# Patient Record
Sex: Male | Born: 1970
Health system: Southern US, Community
[De-identification: ages and names within clinical notes are randomized; demographics above are authoritative.]

## PROBLEM LIST (undated history)

## (undated) DIAGNOSIS — G8929 Other chronic pain: Secondary | ICD-10-CM

## (undated) DIAGNOSIS — F32A Depression, unspecified: Secondary | ICD-10-CM

## (undated) DIAGNOSIS — F329 Major depressive disorder, single episode, unspecified: Secondary | ICD-10-CM

## (undated) DIAGNOSIS — F419 Anxiety disorder, unspecified: Secondary | ICD-10-CM

## (undated) DIAGNOSIS — M549 Dorsalgia, unspecified: Secondary | ICD-10-CM

## (undated) HISTORY — PX: BACK SURGERY: SHX140

---

## 2003-11-01 ENCOUNTER — Inpatient Hospital Stay (HOSPITAL_COMMUNITY): Admission: AD | Admit: 2003-11-01 | Discharge: 2003-11-04 | Payer: Self-pay | Admitting: Neurosurgery

## 2003-11-01 ENCOUNTER — Encounter (INDEPENDENT_AMBULATORY_CARE_PROVIDER_SITE_OTHER): Payer: Self-pay | Admitting: Specialist

## 2003-12-10 ENCOUNTER — Ambulatory Visit (HOSPITAL_BASED_OUTPATIENT_CLINIC_OR_DEPARTMENT_OTHER): Admission: RE | Admit: 2003-12-10 | Discharge: 2003-12-10 | Payer: Self-pay | Admitting: Urology

## 2008-09-05 ENCOUNTER — Emergency Department (HOSPITAL_BASED_OUTPATIENT_CLINIC_OR_DEPARTMENT_OTHER): Admission: EM | Admit: 2008-09-05 | Discharge: 2008-09-05 | Payer: Self-pay | Admitting: Emergency Medicine

## 2008-09-06 ENCOUNTER — Emergency Department (HOSPITAL_BASED_OUTPATIENT_CLINIC_OR_DEPARTMENT_OTHER): Admission: EM | Admit: 2008-09-06 | Discharge: 2008-09-06 | Payer: Self-pay | Admitting: Emergency Medicine

## 2010-09-01 ENCOUNTER — Emergency Department (HOSPITAL_BASED_OUTPATIENT_CLINIC_OR_DEPARTMENT_OTHER)
Admission: EM | Admit: 2010-09-01 | Discharge: 2010-09-01 | Disposition: A | Discharge status: Home / Self Care | Payer: Medicaid Other | Attending: Emergency Medicine | Admitting: Emergency Medicine

## 2010-09-01 DIAGNOSIS — F411 Generalized anxiety disorder: Secondary | ICD-10-CM | POA: Insufficient documentation

## 2010-11-20 NOTE — Discharge Summary (Signed)
NAME:  Paul Baldwin, Paul Baldwin                     ACCOUNT NO.:  [id number]   MEDICAL RECORD NO.:  [id number]                   PATIENT TYPE:  INP   LOCATION:  3007                                 FACILITY:  [facility]   PHYSICIAN:  Reinaldo Meeker, M.D.              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   PRIMARY OPERATIVE PROCEDURE:  L4 to S1 laminectomy with excision of dermal  sinus tract and release of tethered spinal cord.   HISTORY:  Mr. Everard is a 40 year old gentleman who was evaluated in the  office for back and bilateral leg pain with __________evidence of voiding  difficulty.  Skin showed a dimple at a dermal sinus tract, and MRI scan  showed evidence of a tethered spinal cord.  He was admitted at this time for  decompressive laminectomy, excision of his dermal sinus tract, and release  of his tethered cord.   HOSPITAL COURSE:  On April 29, the patient was taken to the operating room  and underwent the above-mentioned procedure.  He tolerated the procedure  well.  Postoperatively, he was neurologically intact.  He was kept flat in  bed for approximately three days to allow the dural opening to heal well.  Subsequently, he was allowed to increase his activities.  His wound healed  without issue.  There is no evidence of cerebral spinal fluid leakage.   On May 2, he was up, doing well, and tolerating a regular diet.  He was felt  that he could be discharged home.   DISCHARGE MEDICATIONS:  Pain medications to be used on a p.r.n. basis.  His  condition was improved versus admission.                                                Reinaldo Meeker, M.D.    ROK/MEDQ  D:  12/26/2003  T:  12/27/2003  Job:  [id number]

## 2010-11-20 NOTE — Op Note (Signed)
NAME:  Paul Baldwin, Paul Baldwin                     ACCOUNT NO.:  [id number]   MEDICAL RECORD NO.:  [id number]                   PATIENT TYPE:  AMB   LOCATION:  NESC                                 FACILITY:  [facility]   PHYSICIAN:  Excell Seltzer. Annabell Howells, M.D.                 DATE OF BIRTH:  Jun 01, 1971   DATE OF PROCEDURE:  12/10/2003  DATE OF DISCHARGE:                                 OPERATIVE REPORT   __________.   OPERATION/PROCEDURE:  Cystoscopy and urethral dilation.   PREOPERATIVE DIAGNOSIS:  Meatal stenosis.   POSTOPERATIVE DIAGNOSIS:  Meatal stenosis.   SURGEON:  Excell Seltzer. Annabell Howells, M.D.   ANESTHESIA:  General.   COMPLICATIONS:  None.   INDICATIONS:  Paul Baldwin is a 40 year old white male who I originally saw  for preoperative urodynamics prior to spinal surgery for a tethered cord.  He returned last week with increased irritative voiding symptoms and a very  slow stream.  He states that it began after his catheter was removed  following surgery.  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  He was complaining of a firm, tender nodule at the distal  urethra.  An attempt in the office to dilate the meatal stenosis was  unsuccessful due to the very severe nature of the narrowing.  It was felt  that treatment in the operating room was indicated.   DESCRIPTION OF PROCEDURE:  The patient had been placed on Levaquin on  Monday, and since taking the antibiotic had begun to have some relief of his  symptoms.  He was taken to the operating room where general anesthesia was  induced.  He was placed in the lithotomy position.  His peritoneum and  genitalia were prepped with Betadine solution and draped in the usual  sterile fashion.  He was actually positioned prior to induction of  anesthesia because of his recent spinal surgery.   An attempt was then made to visualize the meatal stenosis with a 17-French  cystoscope and a 12-degree lens after an initial attempt at passing a 10-  [place] [employer] sound was unsuccessful.  Using the cystoscope, I could see  where the actual lumen was but it was quite punctate.  I then passed a 0.035-  inch guidewire through the stenotic urethra.  There was some difficulty  finding the proper lumen but eventually I was able to negotiate it in and  through to the bladder.  I then used [employer] followers over the wire to  dilate him to 14-French.  At this point a 6-French ureteroscope was used to  visualize the urethra.  No additional stenosis was noted.  His prostatic  urethra was short without obstruction.  I then used [employer] sounds to  dilate him to 24-French.  He was very tight at 24-French.  At this point the  17-French cystoscope with a 12-degree lens was passed.  Inspection of the  bladder revealed no significant abnormalities.  Ureteral orifices were  unremarkable.  At this point the bladder was drained.  The  cystoscope was removed.  The patient was taken down from the lithotomy  position.  His anesthetic was reversed.  He was moved to the recovery room  in stable condition.  There were no complications.   He will be kept on his Levaquin for a total of one week and will follow up  with me next week for reevaluation.                                               Excell Seltzer. Annabell Howells, M.D.    JJW/MEDQ  D:  12/10/2003  T:  12/10/2003  Job:  [id number]   cc:   Reinaldo Meeker, M.D.  [address]  [place]  [place] [address]  Fax: [phone]

## 2010-11-20 NOTE — Op Note (Signed)
NAME:  Paul Baldwin, Paul Baldwin                      ACCOUNT NO.:  [id number]   MEDICAL RECORD NO.:  [id number]                   PATIENT TYPE:  OIB   LOCATION:  2899                                 FACILITY:  [facility]   PHYSICIAN:  Reinaldo Meeker, M.D.              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   OPERATION PERFORMED:  Excision of dermal sinus tract with L4 to S1  laminectomy and release of tethered spinal cord.   SECONDARY PROCEDURE:  Microdissection cauda equina.   SURGEON:  Reinaldo Meeker, M.D.   ASSISTANT:  Kathaleen Maser. Pool, M.D.   ANESTHESIA:  General.   DESCRIPTION OF PROCEDURE:  After being placed in prone position, the  patient's back was prepped and draped in the usual sterile fashion.  Incision was made in the lower lumbar spine and then ellipse was made around  the dermal sinus tract external opening.  Incision was carried down in a  straight line inferior to this tract.  Incision was carried down to the  fascia with continued ellipsing around the dermal sinus tract being carried  out down to the facia.  Subperiosteal dissection was then carried out  bilaterally at L4, L5 and S1 along the spinous processes and lamina.  Self-  retaining retractor was placed for exposure.  The spinous process  of L5 and  inferior aspect of L4 were removed.  The laminectomy of L5 was removed in  order to expose normal-appearing dura.  Boy removal was then carried out  around where the dermal sinus tract went down towards the dura.  When the  dura was well exposed, the sinus tract with the skin attached was amputated  just above the level of the dura.  The dura was then opened in a linear  fashion, starting approximately L5-S1 level and heading  inferiorly.  A  thickened filum terminale with some adherent reactive tissue was easily  identified and found to be coming up towards the dural surface in the dorsal  region.  Just proximal to its attachment, the filum was incised and then  incised again inferior to that and a section of filum was removed.  This  effectively released the tethered spinal cord.  Inspection at this time  showed the nerve roots going ventral to this abnormal granulation type  tissue and it was felt there was nothing to be gained by trying to dissect  them free.  At this time large amounts of irrigation were carried out.  The  dura was closed with interrupted Nurolon stitches.  A piece of Duragen  followed by Bioglue was placed over the dural closure.  The wound was then  closed in multiple layers of Vicryl  in the muscle, fascia, subcutaneous and  subcuticular tissues and staples on the skin.  A sterile dressing was then  applied and the patient was extubated and taken to the recovery room in  stable condition.                                               Reinaldo Meeker, M.D.    ROK/MEDQ  D:  11/01/2003  T:  11/01/2003  Job:  [id number]

## 2010-12-15 ENCOUNTER — Emergency Department (HOSPITAL_COMMUNITY)
Admission: EM | Admit: 2010-12-15 | Discharge: 2010-12-16 | Disposition: A | Discharge status: Other Acute Inpatient Hospital | Payer: Medicaid Other | Source: Home / Self Care | Attending: Emergency Medicine | Admitting: Emergency Medicine

## 2010-12-15 ENCOUNTER — Emergency Department (HOSPITAL_COMMUNITY): Admission: EM | Admit: 2010-12-15 | Payer: Self-pay | Source: Home / Self Care

## 2010-12-15 LAB — DIFFERENTIAL
Eosinophils Relative: 2 % (ref 0–5)
Lymphocytes Relative: 31 % (ref 12–46)
Monocytes Absolute: 0.4 10*3/uL (ref 0.1–1.0)
Monocytes Relative: 4 % (ref 3–12)
Neutro Abs: 6.3 10*3/uL (ref 1.7–7.7)

## 2010-12-15 LAB — ETHANOL: Alcohol, Ethyl (B): <11 mg/dL — ABNORMAL HIGH (ref 0–10)

## 2010-12-15 LAB — CBC
HCT: 43.2 % (ref 39.0–52.0)
Hemoglobin: 14.8 g/dL (ref 13.0–17.0)
MCH: 29.1 pg (ref 26.0–34.0)
MCHC: 34.3 g/dL (ref 30.0–36.0)
RDW: 13 % (ref 11.5–15.5)

## 2010-12-15 LAB — COMPREHENSIVE METABOLIC PANEL
BUN: 15 mg/dL (ref 6–23)
Calcium: 9.4 mg/dL (ref 8.4–10.5)
Creatinine, Ser: 1.09 mg/dL (ref 0.4–1.5)
GFR calc Af Amer: >60 mL/min (ref >60)
Glucose, Bld: 92 mg/dL (ref 70–99)
Total Protein: 7.1 g/dL (ref 6.0–8.3)

## 2010-12-15 LAB — RAPID URINE DRUG SCREEN, HOSP PERFORMED
Benzodiazepines: POSITIVE — AB
Cocaine: NOT DETECTED
Opiates: NOT DETECTED

## 2010-12-16 ENCOUNTER — Inpatient Hospital Stay (HOSPITAL_COMMUNITY)
Admission: AD | Admit: 2010-12-16 | Discharge: 2010-12-18 | DRG: 897 | Disposition: A | Discharge status: Home / Self Care | Payer: Medicaid Other | Source: Ambulatory Visit | Attending: Psychiatry | Admitting: Psychiatry

## 2010-12-16 DIAGNOSIS — F1120 Opioid dependence, uncomplicated: Secondary | ICD-10-CM

## 2010-12-16 DIAGNOSIS — F1994 Other psychoactive substance use, unspecified with psychoactive substance-induced mood disorder: Secondary | ICD-10-CM

## 2010-12-16 DIAGNOSIS — F329 Major depressive disorder, single episode, unspecified: Secondary | ICD-10-CM

## 2010-12-16 DIAGNOSIS — F1320 Sedative, hypnotic or anxiolytic dependence, uncomplicated: Secondary | ICD-10-CM

## 2010-12-16 DIAGNOSIS — R45851 Suicidal ideations: Secondary | ICD-10-CM

## 2010-12-16 DIAGNOSIS — F3289 Other specified depressive episodes: Secondary | ICD-10-CM

## 2010-12-17 DIAGNOSIS — F1120 Opioid dependence, uncomplicated: Secondary | ICD-10-CM

## 2010-12-17 DIAGNOSIS — F1320 Sedative, hypnotic or anxiolytic dependence, uncomplicated: Secondary | ICD-10-CM

## 2010-12-17 LAB — COMPREHENSIVE METABOLIC PANEL
ALT: 18 U/L (ref 0–53)
AST: 13 U/L (ref 0–37)
Albumin: 3.4 g/dL — ABNORMAL LOW (ref 3.5–5.2)
Alkaline Phosphatase: 77 U/L (ref 39–117)
Chloride: 105 mEq/L (ref 96–112)
Potassium: 3.9 mEq/L (ref 3.5–5.1)
Sodium: 140 mEq/L (ref 135–145)
Total Bilirubin: 0.4 mg/dL (ref 0.3–1.2)
Total Protein: 6.4 g/dL (ref 6.0–8.3)

## 2010-12-17 LAB — CBC
HCT: 42.3 % (ref 39.0–52.0)
Platelets: 187 10*3/uL (ref 150–400)
RDW: 13 % (ref 11.5–15.5)
WBC: 6.9 10*3/uL (ref 4.0–10.5)

## 2010-12-17 NOTE — H&P (Signed)
NAME:  Paul Baldwin, Paul Baldwin NO.:  [id number]  MEDICAL RECORD NO.:  [id number]  LOCATION:  0305                          [facility]  PHYSICIAN:  Franchot Gallo, MD     DATE OF BIRTH:  05-07-1971  DATE OF ADMISSION:  12/16/2010 DATE OF DISCHARGE:                      PSYCHIATRIC ADMISSION ASSESSMENT   This is a 40 year old male voluntarily admitted on December 16, 2010.  HISTORY OF PRESENT ILLNESS:  The patient states he is here to get detoxed off of methadone and Xanax.  He states that he has been over using his Xanax and he did make some passive suicidal thoughts to his wife about not wanting to be here.  He has been feeling depressed, reporting problems with motivation, trouble eating and sleeping more, laying on the couch.  He states that he would never harm himself due to his children.  He states that his mother was very concerned about him and had suggested the patient get evaluated for history of depression.  PAST PSYCHIATRIC HISTORY:  First admission to [facility]. Has been going to the [facility] for his methadone. He has been at 80 mg and has been it for he states "too long" for history of opiate abuse and recently has been prescribed Paxil but has not filled the medication.  SOCIAL HISTORY:  The patient is divorced, has two children ages 22 and 68.  He lives with his two children.  He works part-time.  He has no legal problems.  FAMILY HISTORY:  None.  ALCOHOL/DRUG HISTORY:  Denies any alcohol use.  Denies any other substance use.  PRIMARY CARE PROVIDER:  None.  PAST MEDICAL HISTORY:  Medical problems:  None.  MEDICATIONS:  Report being on Xanax 1 mg t.i.d. and his methadone 80 mg daily.  DRUG ALLERGIES:  No known allergies.  PHYSICAL EXAMINATION:  GENERAL:  Physical exam was done in the emergency room.  The patient is a normally-developed male.  He seems somewhat pale and tired but he offers no physical complaints  at this time.  LABORATORY DATA:  His CBC was within normal limits.  CMP within normal limits.  Alcohol level less than 11.  Urine drug screen is positive for benzodiazepines.  MENTAL STATUS EXAM:  The patient is in bed.  He is dressed in hospital scrubs.  He sits up and participates in the interview, minimal eye contact.  His speech is soft-spoken, clear, normal pace and tone.  The patient's mood is depressed.  The patient again looks tired and depressed.  Thought processes are coherent, goal directed.  No evidence of any psychotic symptoms.  He denies any suicidal or homicidal thoughts.  Cognitive function intact.  Memory appears intact.  Judgment and insight are fair.  AXIS I:  Opiate dependence, benzo dependence.  Depressive disorder NOS, rule out substance-induced mood disorder. AXIS II:  Deferred. AXIS III:  No known medical conditions. AXIS IV:  Other psychosocial problems rated to chronic substance use, possible problems with occupation. AXIS V:  Current is 40.  PLAN:  Our plan is to place the patient on the clonidine protocol, monitor withdrawal symptoms.  The patient may benefit from and an antidepressant and assess his motivation for rehab.  Will also identify his support group.  His tentative length of stay at this time is 2-3 days.     Landry Corporal, N.P.   ______________________________ Franchot Gallo, MD    JO/MEDQ  D:  12/17/2010  T:  12/17/2010  Job:  [id number]  Electronically Signed by Limmie PatriciaP. on 12/17/2010 12:27:39 PM Electronically Signed by Franchot Gallo MD on 12/17/2010 04:56:25 PM

## 2010-12-22 NOTE — Discharge Summary (Signed)
  NAME:  Paul Baldwin, Paul Baldwin NO.:  [id number]  MEDICAL RECORD NO.:  [id number]  LOCATION:  0305                          [facility]  PHYSICIAN:  Franchot Gallo, MD     DATE OF BIRTH:  1970-07-22  DATE OF ADMISSION:  12/16/2010 DATE OF DISCHARGE:  12/18/2010                              DISCHARGE SUMMARY   REASON FOR ADMISSION:  This is a 40 year old male here to get detoxed off of methadone and Xanax.  States he was over using his Xanax, having some passive suicidal thoughts, and making a statement about having passive suicidal thoughts to his wife about not wanting to be here, feeling depressed.  Reports positive motivation, trouble eating and sleeping, lying on the couch.  His deterrent for harm to self were his children.  FINAL IMPRESSION:   AXIS I:  Opiate dependence, benzodiazepine dependence, depressive disorder not otherwise specified, rule out substance-induced mood disorder. AXIS II:  Deferred. AXIS III:  No known medical conditions. AXIS IV:  Other psychosocial problems related to chronic substance use, possible problems with occupation. AXIS V:  Current is 55.  PERTINENT LABS:  CBC was within normal limits.  Alcohol level is less than 11.  Urine drug screen is positive for benzodiazepines.  SIGNIFICANT FINDINGS:  The patient was admitted to the [facility] on the [facility] fully alert, cooperative, but looked tired and depressed.  He was put on the clonidine protocol where we were monitoring his withdrawal symptoms.  The patient was participating in groups with good participation, reporting depression all his life, wanting to get help get off his medications.  On the day of discharge the patient stated he decided against an inpatient detox of methadone and wanted to pursue outpatient detox at the [name].  His sleep was decreased.  His appetite was decreased.  He was having mild to moderate depressive symptoms rating  it as a 5/10.  Adamantly denied any suicidal or homicidal thoughts or auditory or visual hallucinations.  Having mild to moderate methadone withdrawal symptoms.  The patient was wanting to be discharged to outpatient followup.  DISCHARGE MEDICATIONS: 1. Librium 25 mg completing the Librium protocol and then stopping. 2. Bentyl q.4 h p.r.n. abdominal cramping. 3. The patient was to stop taking his Xanax.  DISCHARGE FOLLOW UP:  Was with [facility] on December 21, 2010 at [phone].     Landry Corporal, N.P.   ______________________________ Franchot Gallo, MD    JO/MEDQ  D:  12/21/2010  T:  12/21/2010  Job:  [id number]  Electronically Signed by Limmie Patricia.P. on 12/22/2010 10:03:51 AM Electronically Signed by Franchot Gallo MD on 12/22/2010 04:24:51 PM

## 2011-03-20 ENCOUNTER — Emergency Department (HOSPITAL_BASED_OUTPATIENT_CLINIC_OR_DEPARTMENT_OTHER)
Admission: EM | Admit: 2011-03-20 | Discharge: 2011-03-20 | Disposition: A | Discharge status: Home / Self Care | Payer: Medicaid Other | Attending: Emergency Medicine | Admitting: Emergency Medicine

## 2011-03-20 ENCOUNTER — Emergency Department (INDEPENDENT_AMBULATORY_CARE_PROVIDER_SITE_OTHER): Payer: Medicaid Other

## 2011-03-20 ENCOUNTER — Encounter: Payer: Self-pay | Admitting: *Deleted

## 2011-03-20 DIAGNOSIS — S0003XA Contusion of scalp, initial encounter: Secondary | ICD-10-CM | POA: Insufficient documentation

## 2011-03-20 DIAGNOSIS — W19XXXA Unspecified fall, initial encounter: Secondary | ICD-10-CM | POA: Insufficient documentation

## 2011-03-20 DIAGNOSIS — F17200 Nicotine dependence, unspecified, uncomplicated: Secondary | ICD-10-CM | POA: Insufficient documentation

## 2011-03-20 DIAGNOSIS — S0083XA Contusion of other part of head, initial encounter: Secondary | ICD-10-CM | POA: Insufficient documentation

## 2011-03-20 DIAGNOSIS — S0093XA Contusion of unspecified part of head, initial encounter: Secondary | ICD-10-CM

## 2011-03-20 DIAGNOSIS — Y92009 Unspecified place in unspecified non-institutional (private) residence as the place of occurrence of the external cause: Secondary | ICD-10-CM | POA: Insufficient documentation

## 2011-03-20 DIAGNOSIS — R404 Transient alteration of awareness: Secondary | ICD-10-CM

## 2011-03-20 DIAGNOSIS — R55 Syncope and collapse: Secondary | ICD-10-CM | POA: Insufficient documentation

## 2011-03-20 MED ORDER — HYDROXYZINE HCL 25 MG PO TABS: 25.0000 mg | ORAL_TABLET | Freq: Once | ORAL | Status: AC

## 2011-03-20 NOTE — ED Provider Notes (Addendum)
History     CSN: [id number] Arrival date & time: 03/20/2011  6:18 PM   Chief Complaint  Patient presents with  . Loss of Consciousness     (Include location/radiation/quality/duration/timing/severity/associated sxs/prior treatment) Patient is a 40 y.o. male presenting with syncope. The history is provided by the patient. No language interpreter was used.  Loss of Consciousness This is a new problem. The current episode started today. The problem occurs constantly. The problem has been gradually improving. Associated symptoms include weakness. The symptoms are aggravated by nothing. He has tried sleep for the symptoms. The treatment provided mild relief.  Pt reports he was working and very hot.  Pt reports he passed out and hit his head. Pt reports he had jerking per neighbor like he had a seizure.  Pt reports he refused EMS  But was asleep for about 5 hours.  Pt reports he has not been sleeping well. Pt reports he has been under a lot of stress.   Past Medical History  Diagnosis Date  . Spina bifida      Past Surgical History  Procedure Date  . Back surgery     History reviewed. No pertinent family history.  History  Substance Use Topics  . Smoking status: Current Everyday Smoker  . Smokeless tobacco: Not on file  . Alcohol Use: No      Review of Systems  Cardiovascular: Positive for syncope.  Neurological: Positive for weakness.  All other systems reviewed and are negative.    Allergies  Review of patient's allergies indicates no known allergies.  Home Medications   Current Outpatient Rx  Name Route Sig Dispense Refill  . IBUPROFEN 800 MG PO TABS Oral Take 1,600 mg by mouth every 8 (eight) hours as needed. pain       Physical Exam    BP 128/76  Pulse 80  Temp(Src) 98.4 F (36.9 C) (Oral)  Resp 20  Ht 6\' 3"  (1.905 m)  Wt 250 lb (113.399 kg)  BMI 31.25 kg/m2  SpO2 98%  Physical Exam  Vitals reviewed. Constitutional: He is oriented to person,  place, and time. He appears well-developed and well-nourished.  HENT:  Head: Normocephalic.  Eyes: Pupils are equal, round, and reactive to light.  Neck: Normal range of motion.  Cardiovascular: Normal rate.   Pulmonary/Chest: Effort normal.  Abdominal: Soft.  Musculoskeletal: Normal range of motion.  Neurological: He is alert and oriented to person, place, and time. He has normal reflexes.  Skin: Skin is warm.  Psychiatric: He has a normal mood and affect.    ED Course  Procedures  Results for orders placed during the hospital encounter of 12/16/10  CBC      Component Value Range   WBC 6.9  4.0 - 10.5 (K/uL)   RBC 4.97  4.22 - 5.81 (MIL/uL)   Hemoglobin 14.4  13.0 - 17.0 (g/dL)   HCT 40.9  81.1 - 91.4 (%)   MCV 85.1  78.0 - 100.0 (fL)   MCH 29.0  26.0 - 34.0 (pg)   MCHC 34.0  30.0 - 36.0 (g/dL)   RDW 78.2  95.6 - 21.3 (%)   Platelets 187  150 - 400 (K/uL)  COMPREHENSIVE METABOLIC PANEL      Component Value Range   Sodium 140  135 - 145 (mEq/L)   Potassium 3.9  3.5 - 5.1 (mEq/L)   Chloride 105  96 - 112 (mEq/L)   CO2 27  19 - 32 (mEq/L)   Glucose, Bld 96  70 - 99 (mg/dL)   BUN 13  6 - 23 (mg/dL)   Creatinine, Ser 9.14  0.4 - 1.5 (mg/dL)   Calcium 9.1  8.4 - 78.2 (mg/dL)   Total Protein 6.4  6.0 - 8.3 (g/dL)   Albumin 3.4 (*) 3.5 - 5.2 (g/dL)   AST 13  0 - 37 (U/L)   ALT 18  0 - 53 (U/L)   Alkaline Phosphatase 77  39 - 117 (U/L)   Total Bilirubin 0.4  0.3 - 1.2 (mg/dL)   GFR calc non Af Amer >60  >60 (mL/min)   GFR calc Af Amer >60  >60 (mL/min)  TSH      Component Value Range   TSH 0.792  0.350 - 4.500 (uIU/mL)   Ct Head Wo Contrast  03/20/2011  *RADIOLOGY REPORT*  Clinical Data: 40 year old male with loss of consciousness.  CT HEAD WITHOUT CONTRAST  Technique:  Contiguous axial images were obtained from the base of the skull through the vertex without contrast.  Comparison: None.  Findings: Mildly disconjugate gaze, otherwise negative orbit soft tissues.  Visualized scalp soft tissues are within normal limits. Visualized paranasal sinuses and mastoids are clear.  No acute osseous abnormality identified.  Cerebral volume is within normal limits for age.  No midline shift, ventriculomegaly, mass effect, evidence of mass lesion, intracranial hemorrhage or evidence of cortically based acute infarction.  Gray-white matter differentiation is within normal limits throughout the brain.  No suspicious intracranial vascular hyperdensity.  IMPRESSION: Normal noncontrast CT appearance of the brain.  Original Report Authenticated By: Harley Hallmark, M.D.     No diagnosis found.   MDM Pt has a history of narcotic and benzo abuse per old chart.  Pt requesting medication to sleep.  I will treat with visteril,   I doubt seizure.       Langston Masker, [place] 03/20/11 2058  Langston Masker, [place] 03/20/11 2105

## 2011-03-20 NOTE — ED Notes (Signed)
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 Got hot and was trying to cool off. Began shaking and the next thing he knew he woke up 3-4 hours later. EMS was called and came out. VS and FSBS was done and were"perfect". Under a great deal of stress.

## 2011-03-20 NOTE — ED Provider Notes (Signed)
Addendum reviewed and agree  Dayton Bailiff, MD 03/20/11 2238

## 2011-03-20 NOTE — ED Provider Notes (Signed)
Medical screening examination/treatment/procedure(s) were performed by non-physician practitioner and as supervising physician I was immediately available for consultation/collaboration.   Dayton Bailiff, MD 03/20/11 [phone]

## 2011-03-25 ENCOUNTER — Encounter: Payer: Self-pay | Admitting: *Deleted

## 2011-03-25 ENCOUNTER — Emergency Department (HOSPITAL_BASED_OUTPATIENT_CLINIC_OR_DEPARTMENT_OTHER)
Admission: EM | Admit: 2011-03-25 | Discharge: 2011-03-25 | Disposition: A | Discharge status: Home / Self Care | Payer: Self-pay | Attending: Emergency Medicine | Admitting: Emergency Medicine

## 2011-03-25 DIAGNOSIS — F3289 Other specified depressive episodes: Secondary | ICD-10-CM | POA: Insufficient documentation

## 2011-03-25 DIAGNOSIS — F411 Generalized anxiety disorder: Secondary | ICD-10-CM | POA: Insufficient documentation

## 2011-03-25 DIAGNOSIS — F329 Major depressive disorder, single episode, unspecified: Secondary | ICD-10-CM | POA: Insufficient documentation

## 2011-03-25 DIAGNOSIS — F410 Panic disorder [episodic paroxysmal anxiety] without agoraphobia: Secondary | ICD-10-CM | POA: Insufficient documentation

## 2011-03-25 DIAGNOSIS — F17200 Nicotine dependence, unspecified, uncomplicated: Secondary | ICD-10-CM | POA: Insufficient documentation

## 2011-03-25 HISTORY — DX: Anxiety disorder, unspecified: F41.9

## 2011-03-25 MED ORDER — LORAZEPAM 1 MG PO TABS: 1.0000 mg | ORAL_TABLET | Freq: Three times a day (TID) | ORAL | Status: AC | PRN

## 2011-03-25 NOTE — ED Notes (Signed)
Pt c/o increased anxiety and panic attacks x 3 days. Pt states increased stress

## 2011-03-25 NOTE — ED Provider Notes (Addendum)
History     CSN: [id number] Arrival date & time: 03/25/2011  2:42 PM  Chief Complaint  Patient presents with  . Panic Attack    HPI  (Consider location/radiation/quality/duration/timing/severity/associated sxs/prior treatment)  Patient is a 40 y.o. male presenting with anxiety. The history is provided by the patient. No language interpreter was used.  Anxiety This is a chronic problem. The current episode started more than 1 week ago. The problem occurs constantly. The problem has not changed since onset.Pertinent negatives include no chest pain, no abdominal pain and no shortness of breath. The symptoms are aggravated by stress. The symptoms are relieved by medications (Patient has used alprazolam in the past.). He has tried nothing for the symptoms. The treatment provided no relief.   Patient has a history of anxiety and depression has been hospitalized once previously for depression. He denies suicidal or homicidal ideation today. He is on her primary care provider and does not have any access to psychiatric care at this time.  He says that he's had worsening panic attacks recently. He denies any pain with these. He says he occasionally gets some nausea while he's in the middle of panic attack. Here he seemed dynamically stable and not actively having a panic attack. Past Medical History  Diagnosis Date  . Anxiety   . Spina bifida     Past Surgical History  Procedure Date  . Back surgery     History reviewed. No pertinent family history.  patient does mention there is a family history of alcoholism.  History  Substance Use Topics  . Smoking status: Current Everyday Smoker -- 1.0 packs/day  . Smokeless tobacco: Not on file  . Alcohol Use:       Review of Systems  Review of Systems  Constitutional: Negative.   HENT: Negative.   Eyes: Negative.   Respiratory: Negative.  Negative for shortness of breath.   Cardiovascular: Negative.  Negative for chest pain.    Gastrointestinal: Negative.  Negative for abdominal pain.  Genitourinary: Negative.   Musculoskeletal: Negative.   Neurological: Negative.   Hematological: Negative.   Psychiatric/Behavioral: Positive for sleep disturbance, dysphoric mood and decreased concentration. Negative for suicidal ideas, hallucinations and self-injury. The patient is nervous/anxious.        Panic attacks.  All other systems reviewed and are negative.    Allergies  Review of patient's allergies indicates no known allergies.  Home Medications   Current Outpatient Rx  Name Route Sig Dispense Refill  . LORAZEPAM 1 MG PO TABS Oral Take 1 tablet (1 mg total) by mouth 3 (three) times daily as needed for anxiety. 15 tablet 0    Physical Exam    BP 111/73  Pulse 92  Temp(Src) 98.3 F (36.8 C) (Oral)  Resp 20  SpO2 98%  Physical Exam  Nursing note and vitals reviewed. Constitutional: He is oriented to person, place, and time. He appears well-developed and well-nourished. No distress.  HENT:  Head: Normocephalic.  Eyes: Conjunctivae and EOM are normal. Pupils are equal, round, and reactive to light.  Neck: Normal range of motion. Neck supple.  Cardiovascular: Normal rate, regular rhythm and normal heart sounds.  Exam reveals no gallop and no friction rub.   No murmur heard. Pulmonary/Chest: Effort normal and breath sounds normal. No respiratory distress. He has no wheezes. He has no rales.  Abdominal: Soft. Bowel sounds are normal. He exhibits no distension. There is no tenderness.  Musculoskeletal: Normal range of motion.  Neurological: He is alert and  oriented to person, place, and time. No cranial nerve deficit. He exhibits normal muscle tone. Coordination normal.  Skin: Skin is warm and dry. No rash noted.  Psychiatric: His speech is normal and behavior is normal. Judgment normal. His mood appears anxious. Cognition and memory are normal. He exhibits a depressed mood. He expresses no homicidal and no  suicidal ideation. He expresses no suicidal plans and no homicidal plans.    ED Course  Procedures (including critical care time)  Labs Reviewed - No data to display No results found.   1. Depression   2. Anxiety state      MDM Patient was examined by myself and seemed dynamically stable. He was not actively having a panic attack but instead was here to find resources for his recent issues with depression and anxiety. Patient did alprazolam previously. This is prescribed by his primary care provider. He no longer has insurance for a job and therefore does not have a PCP he sees regularly. Patient was given a list of outpatient psychiatric care resources. He absolutely denied suicidal or homicidal ideation. Patient was safe for discharge. He was encouraged to return immediately if he develops suicidal homicidal ideation. Patient was transporting himself today and was not given any medications here in the ED due to this in addition to the fact that he was not actively having a panic attack. I was comfortable with prescribing the patient a short course of benzos should he require them. Patient was told that this cannot be.a regular basis that he would need to find a regular psychiatric care provider.  Assessment: 40 year old who presents today with complaint of increased panic attacks.  Plan: Discharge home in good condition with short course of benzodiazepines and resource referral sheet.        Cyndra Numbers, MD 03/25/11 1540  Cyndra Numbers, MD 04/15/11 [phone]

## 2011-04-28 ENCOUNTER — Encounter: Payer: Self-pay | Admitting: *Deleted

## 2011-06-26 ENCOUNTER — Emergency Department (HOSPITAL_BASED_OUTPATIENT_CLINIC_OR_DEPARTMENT_OTHER)
Admission: EM | Admit: 2011-06-26 | Discharge: 2011-06-26 | Disposition: A | Discharge status: Home / Self Care | Payer: Self-pay | Attending: Emergency Medicine | Admitting: Emergency Medicine

## 2011-06-26 ENCOUNTER — Encounter (HOSPITAL_BASED_OUTPATIENT_CLINIC_OR_DEPARTMENT_OTHER): Payer: Self-pay | Admitting: *Deleted

## 2011-06-26 DIAGNOSIS — Z760 Encounter for issue of repeat prescription: Secondary | ICD-10-CM | POA: Insufficient documentation

## 2011-06-26 DIAGNOSIS — F419 Anxiety disorder, unspecified: Secondary | ICD-10-CM

## 2011-06-26 DIAGNOSIS — F411 Generalized anxiety disorder: Secondary | ICD-10-CM | POA: Insufficient documentation

## 2011-06-26 DIAGNOSIS — F17200 Nicotine dependence, unspecified, uncomplicated: Secondary | ICD-10-CM | POA: Insufficient documentation

## 2011-06-26 MED ORDER — ALPRAZOLAM 1 MG PO TABS: 1.0000 mg | ORAL_TABLET | Freq: Every evening | ORAL | Status: AC | PRN

## 2011-06-26 MED ORDER — ALPRAZOLAM 0.5 MG PO TABS
1.0000 mg | ORAL_TABLET | Freq: Once | ORAL | Status: AC
  Administered 2011-06-26: 1 mg via ORAL
  Filled 2011-06-26: qty 2

## 2011-06-26 NOTE — ED Provider Notes (Signed)
History     CSN: [id number]  Arrival date & time 06/26/11  1323   First MD Initiated Contact with Patient 06/26/11 1506      Chief Complaint  Patient presents with  . Anxiety    (Consider location/radiation/quality/duration/timing/severity/associated sxs/prior treatment) HPI Comments: Pt has run out of xanax.  His MD prescribes it TID for anxiety.  He is under significant new stresses.  He can not have his children for christmas.  He denies suicidality or homicidality.  Patient is a 40 y.o. male presenting with anxiety. The history is provided by the patient. No language interpreter was used.  Anxiety This is a new problem. The problem occurs constantly. The problem has been unchanged. The symptoms are aggravated by nothing. He has tried nothing for the symptoms.    Past Medical History  Diagnosis Date  . Anxiety   . Spina bifida     Past Surgical History  Procedure Date  . Back surgery     History reviewed. No pertinent family history.  History  Substance Use Topics  . Smoking status: Current Everyday Smoker -- 1.0 packs/day  . Smokeless tobacco: Not on file  . Alcohol Use: No      Review of Systems  Neurological: Negative for speech difficulty.  Psychiatric/Behavioral: Positive for dysphoric mood and agitation. Negative for suicidal ideas, behavioral problems, confusion, self-injury and decreased concentration. The patient is nervous/anxious.   All other systems reviewed and are negative.    Allergies  Review of patient's allergies indicates no known allergies.  Home Medications   Current Outpatient Rx  Name Route Sig Dispense Refill  . ALPRAZOLAM 1 MG PO TABS Oral Take 1 mg by mouth 3 (three) times daily.      . MULTIVITAMINS PO CAPS Oral Take 1 capsule by mouth daily.      . IBUPROFEN 800 MG PO TABS Oral Take 1,600 mg by mouth every 8 (eight) hours as needed. pain       BP 115/68  Pulse 68  Temp(Src) 98 F (36.7 C) (Oral)  Resp 20  Ht 6\' 3"   (1.905 m)  Wt 250 lb (113.399 kg)  BMI 31.25 kg/m2  SpO2 98%  Physical Exam  Nursing note and vitals reviewed. Constitutional: He is oriented to person, place, and time. He appears well-developed and well-nourished.  HENT:  Head: Normocephalic and atraumatic.  Eyes: EOM are normal.  Neck: Normal range of motion.  Cardiovascular: Normal rate, regular rhythm, normal heart sounds and intact distal pulses.   Pulmonary/Chest: Effort normal and breath sounds normal. No respiratory distress.  Abdominal: Soft. He exhibits no distension. There is no tenderness.  Musculoskeletal: Normal range of motion.  Neurological: He is alert and oriented to person, place, and time.  Skin: Skin is warm and dry.  Psychiatric: His speech is normal. Judgment and thought content normal. His mood appears anxious. His affect is angry. He is agitated. He is not aggressive, is not hyperactive, not slowed, not withdrawn, not actively hallucinating and not combative. Thought content is not paranoid and not delusional. Cognition and memory are not impaired. He does not express impulsivity or inappropriate judgment. He expresses no homicidal and no suicidal ideation. He expresses no suicidal plans and no homicidal plans. He exhibits normal recent memory and normal remote memory.       Pt is frustrated. He is attentive.    ED Course  Procedures (including critical care time)  Labs Reviewed - No data to display No results found.  No diagnosis found.    MDM          Worthy Rancher, PA 06/26/11 [phone]

## 2011-06-26 NOTE — ED Notes (Signed)
Pt states he had a "conversation" with his ex-wife this a.m. Asked to see kids, etc. And she refused. He is now experiencing anxiety, "Feel a lot of anger, like I could rip somebody's head off, but I'm not a violent person." No eye contact at triage. Took last Xanax this a.m. No money.

## 2011-06-27 NOTE — ED Provider Notes (Signed)
Medical screening examination/treatment/procedure(s) were performed by non-physician practitioner and as supervising physician I was immediately available for consultation/collaboration.   Darshay Deupree, MD 06/27/11 0015 

## 2011-07-17 ENCOUNTER — Encounter (HOSPITAL_BASED_OUTPATIENT_CLINIC_OR_DEPARTMENT_OTHER): Payer: Self-pay

## 2011-07-17 ENCOUNTER — Emergency Department (HOSPITAL_BASED_OUTPATIENT_CLINIC_OR_DEPARTMENT_OTHER)
Admission: EM | Admit: 2011-07-17 | Discharge: 2011-07-17 | Disposition: A | Discharge status: Home / Self Care | Payer: Self-pay | Attending: Emergency Medicine | Admitting: Emergency Medicine

## 2011-07-17 DIAGNOSIS — F3289 Other specified depressive episodes: Secondary | ICD-10-CM | POA: Insufficient documentation

## 2011-07-17 DIAGNOSIS — F419 Anxiety disorder, unspecified: Secondary | ICD-10-CM

## 2011-07-17 DIAGNOSIS — F329 Major depressive disorder, single episode, unspecified: Secondary | ICD-10-CM | POA: Insufficient documentation

## 2011-07-17 DIAGNOSIS — F411 Generalized anxiety disorder: Secondary | ICD-10-CM | POA: Insufficient documentation

## 2011-07-17 DIAGNOSIS — Z760 Encounter for issue of repeat prescription: Secondary | ICD-10-CM | POA: Insufficient documentation

## 2011-07-17 DIAGNOSIS — F17200 Nicotine dependence, unspecified, uncomplicated: Secondary | ICD-10-CM | POA: Insufficient documentation

## 2011-07-17 HISTORY — DX: Depression, unspecified: F32.A

## 2011-07-17 HISTORY — DX: Major depressive disorder, single episode, unspecified: F32.9

## 2011-07-17 MED ORDER — ALPRAZOLAM 1 MG PO TABS: 1.0000 mg | ORAL_TABLET | Freq: Every evening | ORAL | Status: AC | PRN

## 2011-07-17 NOTE — ED Provider Notes (Signed)
History     CSN: [id number]  Arrival date & time 07/17/11  1012   First MD Initiated Contact with Patient 07/17/11 1050      Chief Complaint  Patient presents with  . Depression    (Consider location/radiation/quality/duration/timing/severity/associated sxs/prior treatment) HPI Patient presents with anxiety and insomnia. He states that this is a chronic condition and occurs constantly. He did have a physician who prescribed Xanax 3 times daily for the past several years. He states he has lost his job and insurance and is not able to see this physician for refills any longer. He is under new increased stress do to losing his job and other personal problems. He specifically denies any suicidal or homicidal ideations. He has no associated systemic symptoms no fevers chills vomiting chest pain. There are no other alleviating or modifying factors.  Past Medical History  Diagnosis Date  . Anxiety   . Spina bifida   . Depression     Past Surgical History  Procedure Date  . Back surgery     No family history on file.  History  Substance Use Topics  . Smoking status: Current Everyday Smoker -- 1.0 packs/day  . Smokeless tobacco: Not on file  . Alcohol Use: No      Review of Systems ROS reviewed and otherwise negative except for mentioned in HPI  Allergies  Review of patient's allergies indicates no known allergies.  Home Medications   Current Outpatient Rx  Name Route Sig Dispense Refill  . ALPRAZOLAM 1 MG PO TABS Oral Take 1 mg by mouth 3 (three) times daily.      Marland Kitchen ALPRAZOLAM 1 MG PO TABS Oral Take 1 tablet (1 mg total) by mouth at bedtime as needed for sleep. 30 tablet 0  . ALPRAZOLAM 1 MG PO TABS Oral Take 1 tablet (1 mg total) by mouth at bedtime as needed for sleep. 30 tablet 0  . IBUPROFEN 800 MG PO TABS Oral Take 1,600 mg by mouth every 8 (eight) hours as needed. pain     . MULTIVITAMINS PO CAPS Oral Take 1 capsule by mouth daily.        BP 135/84  Pulse 93   Temp(Src) 99.2 F (37.3 C) (Oral)  Resp 18  Ht 6\' 3"  (1.905 m)  Wt 245 lb (111.131 kg)  BMI 30.62 kg/m2  SpO2 100% Vitals reviewed Physical Exam Physical Examination: General appearance - alert, well appearing, and in no distress Mental status - alert, oriented to person, place, and time Eyes - pupils equal and reactive Chest - clear to auscultation, no wheezes, rales or rhonchi, symmetric air entry Heart - normal rate, regular rhythm, normal S1, S2, no murmurs, rubs, clicks or gallops Abdomen - soft, nontender, nondistended, no masses or organomegaly Neurological - alert, oriented, normal speech, no focal findings Musculoskeletal - no joint tenderness, deformity or swelling Extremities - peripheral pulses normal, no pedal edema, no clubbing or cyanosis Skin - normal coloration and turgor, no rashes Psych- normal affect, mood  ED Course  Procedures (including critical care time)  Labs Reviewed - No data to display No results found.   1. Anxiety   2. Medication refill       MDM  Patient with a history of anxiety and depression presenting to to running out of his Xanax prescription. In reviewing his chart he has been seen twice before in the ED for benzodiazepine prescriptions. I have had a long discussion with him about needing to find a primary provider  to provide his chronic medications. He verbalizes understanding of this. I will prescribe a short course of Xanax today but patient is to be working on finding a primary physician for further medications. He is not suicidal or homicidal today by his report.        Ethelda Chick, MD 07/17/11 [phone]

## 2011-07-17 NOTE — ED Notes (Signed)
Pt c/o depression and anxiety.  Pt states he ran out of his Xanax 4 days ago.  Pt states he is currently unemployed with no insurance and has no PCP.

## 2011-08-26 ENCOUNTER — Encounter (HOSPITAL_BASED_OUTPATIENT_CLINIC_OR_DEPARTMENT_OTHER): Payer: Self-pay

## 2011-08-26 ENCOUNTER — Emergency Department (HOSPITAL_BASED_OUTPATIENT_CLINIC_OR_DEPARTMENT_OTHER)
Admission: EM | Admit: 2011-08-26 | Discharge: 2011-08-26 | Disposition: A | Discharge status: Home / Self Care | Payer: Self-pay | Attending: Emergency Medicine | Admitting: Emergency Medicine

## 2011-08-26 DIAGNOSIS — G8929 Other chronic pain: Secondary | ICD-10-CM | POA: Insufficient documentation

## 2011-08-26 DIAGNOSIS — F341 Dysthymic disorder: Secondary | ICD-10-CM | POA: Insufficient documentation

## 2011-08-26 DIAGNOSIS — M549 Dorsalgia, unspecified: Secondary | ICD-10-CM | POA: Insufficient documentation

## 2011-08-26 NOTE — Discharge Instructions (Signed)
Pain Management, Chronic You have a painful condition that has required frequent use of narcotic-type pain medicine. We would like to see that you receive the best possible care for your problem. To achieve this, you must have a personal physician who can supervise a treatment plan for you. You may locate a personal physician on your own or contact one of the doctors whose name has been given to you. If your physician determines that you need to visit the Emergency Department for pain control, that doctor should provide you with a pain contract. This is a letter from your doctor which describes what pain medicine you may receive, how much and how often. You sign it agreeing to the terms of the treatment plan. Bring this with each time you come to this facility. It will help the Emergency Physician provide the proper treatment for you with minimal delay. Please Note: In the future you may not be able to receive narcotic pain medicine from this facility without a pain contract or telephone approval from your personal physician. Document Released: 02/12/2004 Document Revised: 03/03/2011 Document Reviewed: 06/17/2008 [employer] Patient Information 2012 [employer]. 

## 2011-08-26 NOTE — ED Notes (Signed)
Pt reqesting prescript for xanax and narc, sofia with pt and instructed pt we will not be writing for any narcs

## 2011-08-26 NOTE — ED Provider Notes (Signed)
Medical screening examination/treatment/procedure(s) were performed by non-physician practitioner and as supervising physician I was immediately available for consultation/collaboration.   Rolan Bucco, MD 08/26/11 [phone]

## 2011-08-26 NOTE — ED Provider Notes (Signed)
History     CSN: [id number]  Arrival date & time 08/26/11  1737   First MD Initiated Contact with Patient 08/26/11 1841      Chief Complaint  Patient presents with  . Back Pain    (Consider location/radiation/quality/duration/timing/severity/associated sxs/prior treatment) Patient is a 41 y.o. male presenting with back pain. The history is provided by the patient. No language interpreter was used.  Back Pain  This is a chronic problem. The problem occurs constantly. The problem has been gradually worsening. The pain is associated with no known injury. The pain is present in the lumbar spine. The quality of the pain is described as stabbing and shooting. The pain is at a severity of 7/10. The pain is severe. The symptoms are aggravated by bending and twisting. He has tried analgesics and NSAIDs for the symptoms. The treatment provided no relief.  Pt reports he has spinabifida.  Pt reports he has had multiple back surgerys.  Pt reports he also has chronic anxiety and panic attacks. 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  Pt is out of and does not have an MD to refill.  Pt has been here for the same but has not been able to establish with an MD due to lack of insurance and money.  Past Medical History  Diagnosis Date  . Anxiety   . Spina bifida   . Depression     Past Surgical History  Procedure Date  . Back surgery     No family history on file.  History  Substance Use Topics  . Smoking status: Current Everyday Smoker -- 1.0 packs/day  . Smokeless tobacco: Not on file  . Alcohol Use: No      Review of Systems  Musculoskeletal: Positive for back pain.  All other systems reviewed and are negative.    Allergies  Review of patient's allergies indicates no known allergies.  Home Medications   Current Outpatient Rx  Name Route Sig Dispense Refill  . ACETAMINOPHEN 500 MG PO TABS Oral Take 100-2,500 mg by mouth every 6 (six) hours as needed. For pain     . ALPRAZOLAM 1 MG PO TABS Oral Take 1 mg by mouth 3 (three) times daily.      . IBUPROFEN 800 MG PO TABS Oral Take 800 mg by mouth every 8 (eight) hours as needed. pain    . ADULT MULTIVITAMIN W/MINERALS CH Oral Take 1 tablet by mouth daily.      BP 112/79  Pulse 96  Temp(Src) 98.2 F (36.8 C) (Oral)  Resp 16  Ht 6\' 3"  (1.905 m)  Wt 250 lb (113.399 kg)  BMI 31.25 kg/m2  SpO2 96%  Physical Exam  Nursing note and vitals reviewed. Constitutional: He appears well-developed and well-nourished.  HENT:  Head: Normocephalic and atraumatic.  Right Ear: External ear normal.  Left Ear: External ear normal.  Nose: Nose normal.  Mouth/Throat: Oropharynx is clear and moist.  Eyes: Conjunctivae and EOM are normal. Pupils are equal, round, and reactive to light.  Neck: Normal range of motion. Neck supple.  Abdominal: Soft.    ED Course  Procedures (including critical care time)  Labs Reviewed - No data to display No results found.   No diagnosis found.    MDM  [place] data base reviewed.  Pt has was prescribed 90 xanax Jan 19th.  Pt advised he needs to see his MD.         Langston Masker, [place] 08/26/11 1914

## 2011-08-26 NOTE — ED Notes (Signed)
C/o lower back pain since this am-denies injury-hx of back surgery

## 2012-01-01 ENCOUNTER — Emergency Department (HOSPITAL_BASED_OUTPATIENT_CLINIC_OR_DEPARTMENT_OTHER)
Admission: EM | Admit: 2012-01-01 | Discharge: 2012-01-01 | Disposition: A | Discharge status: Home / Self Care | Payer: Self-pay | Attending: Emergency Medicine | Admitting: Emergency Medicine

## 2012-01-01 ENCOUNTER — Encounter (HOSPITAL_BASED_OUTPATIENT_CLINIC_OR_DEPARTMENT_OTHER): Payer: Self-pay | Admitting: Emergency Medicine

## 2012-01-01 DIAGNOSIS — F411 Generalized anxiety disorder: Secondary | ICD-10-CM | POA: Insufficient documentation

## 2012-01-01 DIAGNOSIS — F3289 Other specified depressive episodes: Secondary | ICD-10-CM | POA: Insufficient documentation

## 2012-01-01 DIAGNOSIS — Z79899 Other long term (current) drug therapy: Secondary | ICD-10-CM | POA: Insufficient documentation

## 2012-01-01 DIAGNOSIS — F419 Anxiety disorder, unspecified: Secondary | ICD-10-CM

## 2012-01-01 DIAGNOSIS — F329 Major depressive disorder, single episode, unspecified: Secondary | ICD-10-CM | POA: Insufficient documentation

## 2012-01-01 DIAGNOSIS — K029 Dental caries, unspecified: Secondary | ICD-10-CM | POA: Insufficient documentation

## 2012-01-01 DIAGNOSIS — F17200 Nicotine dependence, unspecified, uncomplicated: Secondary | ICD-10-CM | POA: Insufficient documentation

## 2012-01-01 DIAGNOSIS — Q059 Spina bifida, unspecified: Secondary | ICD-10-CM | POA: Insufficient documentation

## 2012-01-01 LAB — COMPREHENSIVE METABOLIC PANEL
CO2: 29 mEq/L (ref 19–32)
Calcium: 9.2 mg/dL (ref 8.4–10.5)
Chloride: 100 mEq/L (ref 96–112)
Creatinine, Ser: 1 mg/dL (ref 0.50–1.35)
GFR calc Af Amer: >90 mL/min (ref >90)
GFR calc non Af Amer: >90 mL/min (ref >90)
Glucose, Bld: 108 mg/dL — ABNORMAL HIGH (ref 70–99)
Total Bilirubin: 0.2 mg/dL — ABNORMAL LOW (ref 0.3–1.2)

## 2012-01-01 MED ORDER — PENICILLIN V POTASSIUM 500 MG PO TABS: 500.0000 mg | ORAL_TABLET | Freq: Four times a day (QID) | ORAL | Status: AC

## 2012-01-01 MED ORDER — HYDROCODONE-ACETAMINOPHEN 5-325 MG PO TABS: ORAL_TABLET | ORAL | Status: DC

## 2012-01-01 MED ORDER — LORAZEPAM 1 MG PO TABS
0.5000 mg | ORAL_TABLET | Freq: Three times a day (TID) | ORAL | Status: AC | PRN
Start: 2012-01-01 — End: 2012-01-11

## 2012-01-01 MED ORDER — PENICILLIN V POTASSIUM 250 MG PO TABS
500.0000 mg | ORAL_TABLET | Freq: Once | ORAL | Status: AC
  Administered 2012-01-01: 500 mg via ORAL
  Filled 2012-01-01: qty 2

## 2012-01-01 NOTE — ED Notes (Signed)
C/O toothache, upper molar, x 2-3 days. States tooth has been broken off for "a while". Unable to afford to go to dentist. Right face swollen.

## 2012-01-02 NOTE — ED Provider Notes (Signed)
History     CSN: [id number]  Arrival date & time 01/01/12  4098   First MD Initiated Contact with Patient 01/01/12 1044      Chief Complaint  Patient presents with  . Dental Pain    (Consider location/radiation/quality/duration/timing/severity/associated sxs/prior treatment) HPI Patient is a 41 year old male who presents today complaining of right upper quadrant mouth pain with associated dental caries or the past 2-3 days. He reports that the pain is 10 out of 10. He reports that he has had fractures of these teeth for several weeks. He has not seen a dentist as he reports he cannot afford one. Patient also complains of having ongoing issues of anxiety. He apparently was on Xanax 1 mg 3 times a day previously but has not been to see his primary care provider to have this filled. Patient reports that given his 10 out of 10 dental pain he has been taking up to 6 extra strength Tylenol at a time. Last dose of Tylenol was at 2 AM this morning. Patient endorses some mild nausea. He denies fevers, night sweats, vomiting, or abdominal pain. Patient has no known history of liver disease. He is having no difficulty swallowing or breathing. His pain is worse with eating and nothing has made this better. There are no other associated or modifying factors. Past Medical History  Diagnosis Date  . Anxiety   . Spina bifida   . Depression     Past Surgical History  Procedure Date  . Back surgery     History reviewed. No pertinent family history.  History  Substance Use Topics  . Smoking status: Current Everyday Smoker -- 1.0 packs/day  . Smokeless tobacco: Not on file  . Alcohol Use: No      Review of Systems  Constitutional: Negative.   HENT: Positive for facial swelling and dental problem.   Eyes: Negative.   Respiratory: Negative.   Cardiovascular: Negative.   Gastrointestinal: Positive for nausea.  Genitourinary: Negative.   Musculoskeletal: Negative.   Skin: Negative.     Neurological: Negative.   Hematological: Negative.   Psychiatric/Behavioral: The patient is nervous/anxious.   All other systems reviewed and are negative.    Allergies  Review of patient's allergies indicates no known allergies.  Home Medications   Current Outpatient Rx  Name Route Sig Dispense Refill  . ACETAMINOPHEN 500 MG PO TABS Oral Take 100-2,500 mg by mouth every 6 (six) hours as needed. For pain    . ALPRAZOLAM 1 MG PO TABS Oral Take 1 mg by mouth 3 (three) times daily.      Marland Kitchen HYDROCODONE-ACETAMINOPHEN 5-325 MG PO TABS  Take 1-2 tablets by mouth every 6 hours when necessary pain. 10 tablet 0  . IBUPROFEN 800 MG PO TABS Oral Take 800 mg by mouth every 8 (eight) hours as needed. pain    . LORAZEPAM 1 MG PO TABS Oral Take 0.5 tablets (0.5 mg total) by mouth every 8 (eight) hours as needed for anxiety. 10 tablet 0  . ADULT MULTIVITAMIN W/MINERALS CH Oral Take 1 tablet by mouth daily.    Marland Kitchen PENICILLIN V POTASSIUM 500 MG PO TABS Oral Take 1 tablet (500 mg total) by mouth 4 (four) times daily. 28 tablet 0    BP 117/67  Pulse 64  Temp 98 F (36.7 C) (Oral)  Resp 18  Ht 6\' 3"  (1.905 m)  Wt 250 lb (113.399 kg)  BMI 31.25 kg/m2  SpO2 96%  Physical Exam  Nursing note and vitals  reviewed. GEN: Well-developed, well-nourished male in no distress HEENT: Atraumatic, patient noted to have right upper quadrant facial swelling over the right maxilla. Patient has numerous dental caries in the right upper quadrant. Fractured teeth are noted as well. Patient reports this has been present for some time. There is no buccal space or sublingual space fluctuance to suggest dental abscess.  EYES: PERRLA BL, no scleral icterus. No pain with extraocular movements. Extraocular movements are intact. NECK: Trachea midline, no meningismus. No limitation of range of motion. CV: regular rate and rhythm. No murmurs, rubs, or gallops PULM: No respiratory distress.  No crackles, wheezes, or rales. GI:  soft, non-tender. No guarding, rebound, or tenderness. + bowel sounds  GU: deferred Neuro: cranial nerves grossly 2-12 intact, no abnormalities of strength or sensation, A and O x 3 MSK: Patient moves all 4 extremities symmetrically, no deformity, edema, or injury noted Skin: No rashes petechiae, purpura, or jaundice Psych: Denies suicidal homicidal ideation. Does report anxiety.  ED Course  Procedures (including critical care time)  Labs Reviewed  COMPREHENSIVE METABOLIC PANEL - Abnormal; Notable for the following:    Glucose, Bld 108 (*)     Total Bilirubin 0.2 (*)     All other components within normal limits  ACETAMINOPHEN LEVEL   No results found.   1. Dental caries   2. Anxiety       MDM  Patient was evaluated by myself. Based on evaluation he did appear to have dental caries. There were no drainable abscess is noted. Patient was given penicillin 500 mg for this was discharged with a prescription for this. Patient also reported taking significant amounts of Tylenol. He was 8 hours from last ingestion. Conference metabolic panel and Tylenol level were performed. Tylenol level was less than 15. Liver function was also completely within normal limits. Patient was not vomiting here. He was felt cleared from this standpoint and was advised extensively about not taking too many Tylenol. Patient also reported anxiety and difficulty with having access to his medications. I did fill comfortable prescribing him Ativan 0.5 mg to be taken every 8 hours as needed for anxiety.notified the patient that this was not to be taken on a daily basis. Patient was given a total of 10 tablets. Patient was told that he should followup with behavioral health for assistance with obtaining appropriate followup and feeling these are medications as we cannot fill these through the emergency department on a regular basis. Patient was also given 10 tablets of Vicodin for pain. He was referred to Dr. Lucky Cowboy who was on  call for dentistry. Patient was told that he must call within 48 hours it was strongly encouraged to call first thing on Monday morning for followup. He was told that no upper front cost to be assessed. Patient was discharged in good condition.        Cyndra Numbers, MD 01/02/12 [phone]

## 2012-09-22 ENCOUNTER — Encounter (HOSPITAL_BASED_OUTPATIENT_CLINIC_OR_DEPARTMENT_OTHER): Payer: Self-pay

## 2012-09-22 ENCOUNTER — Emergency Department (HOSPITAL_BASED_OUTPATIENT_CLINIC_OR_DEPARTMENT_OTHER)
Admission: EM | Admit: 2012-09-22 | Discharge: 2012-09-22 | Disposition: A | Discharge status: Home / Self Care | Payer: Self-pay | Attending: Emergency Medicine | Admitting: Emergency Medicine

## 2012-09-22 DIAGNOSIS — S8990XA Unspecified injury of unspecified lower leg, initial encounter: Secondary | ICD-10-CM | POA: Insufficient documentation

## 2012-09-22 DIAGNOSIS — Y9389 Activity, other specified: Secondary | ICD-10-CM | POA: Insufficient documentation

## 2012-09-22 DIAGNOSIS — Z79899 Other long term (current) drug therapy: Secondary | ICD-10-CM | POA: Insufficient documentation

## 2012-09-22 DIAGNOSIS — F419 Anxiety disorder, unspecified: Secondary | ICD-10-CM

## 2012-09-22 DIAGNOSIS — Y929 Unspecified place or not applicable: Secondary | ICD-10-CM | POA: Insufficient documentation

## 2012-09-22 DIAGNOSIS — F411 Generalized anxiety disorder: Secondary | ICD-10-CM | POA: Insufficient documentation

## 2012-09-22 DIAGNOSIS — Z87798 Personal history of other (corrected) congenital malformations: Secondary | ICD-10-CM | POA: Insufficient documentation

## 2012-09-22 DIAGNOSIS — Z8659 Personal history of other mental and behavioral disorders: Secondary | ICD-10-CM | POA: Insufficient documentation

## 2012-09-22 DIAGNOSIS — S99919A Unspecified injury of unspecified ankle, initial encounter: Secondary | ICD-10-CM | POA: Insufficient documentation

## 2012-09-22 DIAGNOSIS — M79675 Pain in left toe(s): Secondary | ICD-10-CM

## 2012-09-22 DIAGNOSIS — F17200 Nicotine dependence, unspecified, uncomplicated: Secondary | ICD-10-CM | POA: Insufficient documentation

## 2012-09-22 DIAGNOSIS — IMO0002 Reserved for concepts with insufficient information to code with codable children: Secondary | ICD-10-CM | POA: Insufficient documentation

## 2012-09-22 MED ORDER — LORAZEPAM 1 MG PO TABS: 1.0000 mg | ORAL_TABLET | Freq: Three times a day (TID) | ORAL | Status: DC | PRN

## 2012-09-22 NOTE — ED Notes (Signed)
Pt reports he has a history of anxiety, has been on Xanax but not currently taking.  He also reports an injury to left 5th digit on left toe after dropping a heavy object on it.

## 2012-09-22 NOTE — ED Provider Notes (Signed)
History     CSN: [id number]  Arrival date & time 09/22/12  1124   First MD Initiated Contact with Patient 09/22/12 1206      Chief Complaint  Patient presents with  . Anxiety  . Toe Injury    (Consider location/radiation/quality/duration/timing/severity/associated sxs/prior treatment) HPI Pt presents with c/o worsening anxiety- has run out of xanax and feels this was helping to control his symptoms.  States he does not have money to see his PMD.  Denies feeling suicidal.  States that he is afraid of losing his job due to his level of anxiety.  Also c/o pain in left 5th toe after hitting it with a sledgehammer approx 1 week ago.  States he has not tried anything for pain.  There are no other associated systemic symptoms, there are no other alleviating or modifying factors.   Past Medical History  Diagnosis Date  . Anxiety   . Spina bifida   . Depression     Past Surgical History  Procedure Laterality Date  . Back surgery      No family history on file.  History  Substance Use Topics  . Smoking status: Current Every Day Smoker -- 1.00 packs/day  . Smokeless tobacco: Not on file  . Alcohol Use: No      Review of Systems ROS reviewed and all otherwise negative except for mentioned in HPI  Allergies  Review of patient's allergies indicates no known allergies.  Home Medications   Current Outpatient Rx  Name  Route  Sig  Dispense  Refill  . methadone (METHADOSE) 40 MG disintegrating tablet   Oral   Take 40 mg by mouth daily.         Marland Kitchen LORazepam (ATIVAN) 1 MG tablet   Oral   Take 1 tablet (1 mg total) by mouth 3 (three) times daily as needed for anxiety.   12 tablet   0     BP 137/84  Pulse 89  Temp(Src) 98.1 F (36.7 C) (Oral)  Resp 20  Ht 6\' 4"  (1.93 m)  Wt 250 lb (113.399 kg)  BMI 30.44 kg/m2  SpO2 97% Vitals reviewed Physical Exam Physical Examination: General appearance - alert, well appearing, and in no distress Mental status - alert,  oriented to person, place, and time Eyes - no conjunctival injection, no scleral icterus Mouth - mucous membranes moist, pharynx normal without lesions Chest - clear to auscultation, no wheezes, rales or rhonchi, symmetric air entry Heart - normal rate, regular rhythm, normal S1, S2, no murmurs, rubs, clicks or gallops Neurological - alert, oriented, normal speech, strength 5/5 in extremities x 4, sensation intact Extremities - peripheral pulses normal, no pedal edema, no clubbing or cyanosis Skin - normal coloration and turgor, no rashes, no suspicious skin lesions noted Psych- calm and cooperative, tearful when talking about his anxiety symptoms  ED Course  Procedures (including critical care time)  Labs Reviewed - No data to display No results found.   1. Anxiety   2. Toe pain, left       MDM  Pt presenting with anxiety off xanax.  Also c/o pain in toe after hitting it by accident with a sledgehammer.  Injury occurred one week ago.  Elected not to image toe as it was isolated injury and pt states he thinks that ibuprofen would help it, no signs of complications.  Long d/w patient about rx from ED for chronic anxiety.  Will give him short course of medication, but encouraged f/u with  PMD for further prescriptions.  Discharged with strict return precautions.  Pt agreeable with plan.        Ethelda Chick, MD 09/22/12 1310

## 2012-10-01 ENCOUNTER — Encounter (HOSPITAL_BASED_OUTPATIENT_CLINIC_OR_DEPARTMENT_OTHER): Payer: Self-pay

## 2012-10-01 ENCOUNTER — Emergency Department (HOSPITAL_BASED_OUTPATIENT_CLINIC_OR_DEPARTMENT_OTHER)
Admission: EM | Admit: 2012-10-01 | Discharge: 2012-10-01 | Disposition: A | Discharge status: Home / Self Care | Payer: Self-pay | Attending: Emergency Medicine | Admitting: Emergency Medicine

## 2012-10-01 DIAGNOSIS — Z79899 Other long term (current) drug therapy: Secondary | ICD-10-CM | POA: Insufficient documentation

## 2012-10-01 DIAGNOSIS — R630 Anorexia: Secondary | ICD-10-CM | POA: Insufficient documentation

## 2012-10-01 DIAGNOSIS — Z8659 Personal history of other mental and behavioral disorders: Secondary | ICD-10-CM

## 2012-10-01 DIAGNOSIS — G479 Sleep disorder, unspecified: Secondary | ICD-10-CM | POA: Insufficient documentation

## 2012-10-01 DIAGNOSIS — F3289 Other specified depressive episodes: Secondary | ICD-10-CM | POA: Insufficient documentation

## 2012-10-01 DIAGNOSIS — F410 Panic disorder [episodic paroxysmal anxiety] without agoraphobia: Secondary | ICD-10-CM | POA: Insufficient documentation

## 2012-10-01 DIAGNOSIS — F17200 Nicotine dependence, unspecified, uncomplicated: Secondary | ICD-10-CM | POA: Insufficient documentation

## 2012-10-01 DIAGNOSIS — Z8739 Personal history of other diseases of the musculoskeletal system and connective tissue: Secondary | ICD-10-CM | POA: Insufficient documentation

## 2012-10-01 DIAGNOSIS — F329 Major depressive disorder, single episode, unspecified: Secondary | ICD-10-CM | POA: Insufficient documentation

## 2012-10-01 MED ORDER — LORAZEPAM 1 MG PO TABS: 1.0000 mg | ORAL_TABLET | Freq: Four times a day (QID) | ORAL | Status: DC | PRN

## 2012-10-01 NOTE — ED Provider Notes (Signed)
History     CSN: [id number]  Arrival date & time 10/01/12  1057   First MD Initiated Contact with Patient 10/01/12 1208      Chief Complaint  Patient presents with  . Panic Attack    (Consider location/radiation/quality/duration/timing/severity/associated sxs/prior treatment) HPI Comments: Patient is a 42 y/o M with PMHx of anxiety and depression that has gotten progressively worse over the past 2-3 days. Patient was seen in ED on 09/22/2012 and was prescribed Lorazepam - patient ran out of medication. Patient reported episodes of anxiety can last days, feels like people are watching him, he is unable to sleep, has decreased appetite, has difficulty concentrating at work. Patient has been off of Xanax and currently taking Ativan 1 mg tablets four times a day for relief - but, patient reported he ran out of Ativan and panic attacks returned and increased. Patient reported trying to take deep breathes and reading the Bible with no help in calming down. Patient reported that if the panic attacks last a long time he has suicidal thoughts - patient denied suicidal ideation, thoughts of hurting others, and a plan at this moment. Patient denied chest pain, shortness of breathe, difficulty breathing, abdominal pain, dysphagia, odynphagia, urinary symptoms, leg pain, leg swelling, visual distortions, hallucinations.   Patient is a 42 y.o. male presenting with anxiety. The history is provided by the patient. No language interpreter was used.  Anxiety This is a recurrent problem. The current episode started in the past 7 days. The problem occurs constantly. The problem has been unchanged. Associated symptoms include anorexia. Pertinent negatives include no abdominal pain, arthralgias, change in bowel habit, chest pain, chills, fatigue, fever, headaches, nausea, neck pain, sore throat, urinary symptoms, visual change, vomiting or weakness. He has tried nothing for the symptoms.    Past Medical History   Diagnosis Date  . Anxiety   . Spina bifida   . Depression     Past Surgical History  Procedure Laterality Date  . Back surgery      History reviewed. No pertinent family history.  History  Substance Use Topics  . Smoking status: Current Every Day Smoker -- 2.00 packs/day for 25 years    Types: Cigarettes  . Smokeless tobacco: Never Used  . Alcohol Use: No      Review of Systems  Constitutional: Negative for fever, chills and fatigue.  HENT: Negative for sore throat and neck pain.   Cardiovascular: Negative for chest pain.  Gastrointestinal: Positive for anorexia. Negative for nausea, vomiting, abdominal pain, diarrhea, constipation and change in bowel habit.  Musculoskeletal: Negative for arthralgias.  Neurological: Negative for weakness and headaches.  Psychiatric/Behavioral: Positive for sleep disturbance and decreased concentration. Negative for hallucinations. The patient is nervous/anxious.   All other systems reviewed and are negative.    Allergies  Review of patient's allergies indicates no known allergies.  Home Medications   Current Outpatient Rx  Name  Route  Sig  Dispense  Refill  . ALPRAZolam (XANAX PO)   Oral   Take by mouth 4 (four) times daily as needed.         Marland Kitchen LORazepam (ATIVAN) 1 MG tablet   Oral   Take 1 tablet (1 mg total) by mouth 3 (three) times daily as needed for anxiety.   12 tablet   0   . methadone (METHADOSE) 40 MG disintegrating tablet   Oral   Take 40 mg by mouth daily.         Marland Kitchen LORazepam (ATIVAN)  1 MG tablet   Oral   Take 1 tablet (1 mg total) by mouth every 6 (six) hours as needed for anxiety.   10 tablet   0     BP 129/71  Pulse 77  Temp(Src) 98.9 F (37.2 C) (Oral)  Resp 18  Ht 6\' 4"  (1.93 m)  Wt 260 lb (117.935 kg)  BMI 31.66 kg/m2  SpO2 96%  Physical Exam  Nursing note and vitals reviewed. Constitutional: He is oriented to person, place, and time. He appears well-developed and well-nourished. No  distress.  HENT:  Head: Normocephalic and atraumatic.  Eyes: Conjunctivae and EOM are normal. Pupils are equal, round, and reactive to light. Right eye exhibits no discharge. Left eye exhibits no discharge.  Neck: Normal range of motion. Neck supple. No tracheal deviation present.  Cardiovascular: Normal rate, regular rhythm and intact distal pulses.  Exam reveals no friction rub.   No murmur heard. Pulmonary/Chest: Effort normal and breath sounds normal. No respiratory distress. He has no wheezes. He has no rales.  Abdominal: Soft. He exhibits no distension. There is no tenderness. There is no rebound.  Lymphadenopathy:    He has no cervical adenopathy.  Neurological: He is alert and oriented to person, place, and time. No cranial nerve deficit. He exhibits normal muscle tone. Coordination normal.  Skin: Skin is warm and dry. No rash noted. He is not diaphoretic. No erythema.  Psychiatric: His behavior is normal. Judgment and thought content normal.  Flat affect.     ED Course  Procedures (including critical care time)  Labs Reviewed - No data to display No results found.  Filed Vitals:   10/01/12 1110  BP: 129/71  Pulse: 77  Temp: 98.9 F (37.2 C)  Resp: 18     1. Panic attack   2. History of anxiety   3. History of depression       MDM  I personally evaluated and examined patient - physical exam non-contributory findings. Discussed case with Dr. Bebe Shaggy - Dr. Bebe Shaggy evaluated and spoke with the patient, reported no signs of suicidal ideation - Dr. Bebe Shaggy reported patient to be good for discharge. Patient is afebrile, normotensive, non-tachycardic, alert and oriented, flat affect. No sign of harm to self or others. Reviewed patient's chart - last seen here on 09/22/2012 and was discharged with Ativan for recurrent anxiety attacks due to not having a pcp because no health insurance. Discharged patient with Ativan 1 mg tabs - discussed with patient on how to use  medications, to use sparingly and to use without use of drugs or alcohol - patient understood. Gave patient list of references - recommended following up with [facility] - recommended patient to be seen there and to participate in therapy/counseling sessions to learn healthy techniques on how to contain anxiety. Discussed that if symptoms are to worsen to please report back to the ED. Patient agreed to plan of care, understood, all questions answered.         Raymon Mutton, PA-C 10/01/12 1640

## 2012-10-01 NOTE — ED Provider Notes (Signed)
Pt seen with PA Pt is here for recurrent anxiety He denies SI currently Short course of ativan advised due to chronicity of use of benzos He has been referred to [facility]   Joya Gaskins, MD 10/01/12 1249

## 2012-10-01 NOTE — ED Notes (Signed)
Pt states that he is having anxiety (panic attack).  Pt states that he feels like people are staring at him, feels depressed, feels like crying, and has sense of anxiousness that he cannot get rid of.  Pt needs to take medication for these symptoms but has run out of it and cannot get a refill because he does not have pcp because he lost his insurance.

## 2012-10-03 NOTE — ED Provider Notes (Signed)
Medical screening examination/treatment/procedure(s) were conducted as a shared visit with non-physician practitioner(s) and myself.  I personally evaluated the patient during the encounter  Pt denies SI on my exam.  He is in no acute distress.  Advised need for outpatient evaluation Pt agreeable with plan  Joya Gaskins, MD 10/03/12 [phone]

## 2012-11-13 ENCOUNTER — Encounter (HOSPITAL_BASED_OUTPATIENT_CLINIC_OR_DEPARTMENT_OTHER): Payer: Self-pay | Admitting: *Deleted

## 2012-11-13 ENCOUNTER — Emergency Department (HOSPITAL_BASED_OUTPATIENT_CLINIC_OR_DEPARTMENT_OTHER)
Admission: EM | Admit: 2012-11-13 | Discharge: 2012-11-13 | Disposition: A | Discharge status: Home / Self Care | Payer: Self-pay | Attending: Emergency Medicine | Admitting: Emergency Medicine

## 2012-11-13 DIAGNOSIS — F411 Generalized anxiety disorder: Secondary | ICD-10-CM | POA: Insufficient documentation

## 2012-11-13 DIAGNOSIS — Z79899 Other long term (current) drug therapy: Secondary | ICD-10-CM | POA: Insufficient documentation

## 2012-11-13 DIAGNOSIS — F17200 Nicotine dependence, unspecified, uncomplicated: Secondary | ICD-10-CM | POA: Insufficient documentation

## 2012-11-13 DIAGNOSIS — F329 Major depressive disorder, single episode, unspecified: Secondary | ICD-10-CM | POA: Insufficient documentation

## 2012-11-13 DIAGNOSIS — Z87798 Personal history of other (corrected) congenital malformations: Secondary | ICD-10-CM | POA: Insufficient documentation

## 2012-11-13 DIAGNOSIS — G479 Sleep disorder, unspecified: Secondary | ICD-10-CM | POA: Insufficient documentation

## 2012-11-13 DIAGNOSIS — F3289 Other specified depressive episodes: Secondary | ICD-10-CM | POA: Insufficient documentation

## 2012-11-13 DIAGNOSIS — F419 Anxiety disorder, unspecified: Secondary | ICD-10-CM

## 2012-11-13 MED ORDER — ALPRAZOLAM 1 MG PO TABS: 1.0000 mg | ORAL_TABLET | Freq: Every evening | ORAL | Status: DC | PRN

## 2012-11-13 NOTE — ED Notes (Signed)
Pt amb to room 2 with quick steady gait in nad. Pt reports an increase in his anxiety since last night, he did not sleep well despite reading the bible. Pt states he has lost his insurance, and has had to stop his anti depressants and "my 4 one mg xanaxs a day..." denies any si or hi.

## 2012-11-13 NOTE — ED Provider Notes (Signed)
Medical screening examination/treatment/procedure(s) were performed by non-physician practitioner and as supervising physician I was immediately available for consultation/collaboration.  Geoffery Lyons, MD 11/13/12 1432

## 2012-11-13 NOTE — ED Provider Notes (Signed)
History     CSN: [id number]  Arrival date & time 11/13/12  1236   First MD Initiated Contact with Patient 11/13/12 1315      Chief Complaint  Patient presents with  . Anxiety    (Consider location/radiation/quality/duration/timing/severity/associated sxs/prior treatment) HPI Comments: Patient is a 42 year old male who presents today with anxiety. He states, it has been plaguing him his entire life, but last year he lost his insurance and had to stop taking antidepressants and the Xanax that he normally takes. He states his anxiety has been out of control, not letting him sleep, his mind is constantly running. He states he gets the "willies and shakes" when he is having an attack. He states that his anxiety was well controlled when he was on an antidepressant and 4 mg of Xanax daily. He denies fever, nausea, vomiting, abdominal pain, shortness of breath, numbness, weakness, paresthesias.  The history is provided by the patient. No language interpreter was used.    Past Medical History  Diagnosis Date  . Anxiety   . Spina bifida   . Depression     Past Surgical History  Procedure Laterality Date  . Back surgery      History reviewed. No pertinent family history.  History  Substance Use Topics  . Smoking status: Current Every Day Smoker -- 2.00 packs/day for 25 years    Types: Cigarettes  . Smokeless tobacco: Never Used  . Alcohol Use: No      Review of Systems  Constitutional: Negative for fever.  Respiratory: Negative for shortness of breath.   Cardiovascular: Negative for chest pain.  Gastrointestinal: Negative for nausea, vomiting and abdominal pain.  Psychiatric/Behavioral: Positive for sleep disturbance. Negative for suicidal ideas and self-injury. The patient is nervous/anxious.   All other systems reviewed and are negative.    Allergies  Review of patient's allergies indicates no known allergies.  Home Medications   Current Outpatient Rx  Name  Route   Sig  Dispense  Refill  . ALPRAZolam (XANAX PO)   Oral   Take by mouth 4 (four) times daily as needed.         Marland Kitchen LORazepam (ATIVAN) 1 MG tablet   Oral   Take 1 tablet (1 mg total) by mouth 3 (three) times daily as needed for anxiety.   12 tablet   0   . LORazepam (ATIVAN) 1 MG tablet   Oral   Take 1 tablet (1 mg total) by mouth every 6 (six) hours as needed for anxiety.   10 tablet   0   . methadone (METHADOSE) 40 MG disintegrating tablet   Oral   Take 40 mg by mouth daily.           BP 122/69  Pulse 88  Temp(Src) 98.1 F (36.7 C) (Oral)  Resp 16  Ht 6\' 4"  (1.93 m)  Wt 240 lb (108.863 kg)  BMI 29.23 kg/m2  SpO2 98%  Physical Exam  Nursing note and vitals reviewed. Constitutional: He is oriented to person, place, and time. He appears well-developed and well-nourished. No distress.  HENT:  Head: Normocephalic and atraumatic.  Right Ear: External ear normal.  Left Ear: External ear normal.  Nose: Nose normal.  Eyes: Conjunctivae are normal.  Neck: Normal range of motion. No tracheal deviation present.  Cardiovascular: Normal rate, regular rhythm and normal heart sounds.   Pulmonary/Chest: Effort normal and breath sounds normal. No stridor.  Abdominal: Soft. He exhibits no distension. There is no tenderness.  Musculoskeletal:  Normal range of motion.  Neurological: He is alert and oriented to person, place, and time.  Skin: Skin is warm and dry. He is not diaphoretic.  Psychiatric: He has a normal mood and affect. His behavior is normal.    ED Course  Procedures (including critical care time)  Labs Reviewed - No data to display No results found.   1. Anxiety       MDM  Patient presents today with anxiety attack. This is a chronic problem for him. He states his insurance will kick in in 10 days and he will have an easier time following up with outpatient resources. Discussed with the emergency department is not an appropriate place to get your anxiety  medications refilled. He was given 30 days of Xanax. No SI/HI. Followup in the community with the resource guide that was given. Vital signs stable for discharge. Patient / Family / Caregiver informed of clinical course, understand medical decision-making process, and agree with plan.         Mora Bellman, PA-C 11/13/12 1330

## 2012-12-03 ENCOUNTER — Encounter (HOSPITAL_BASED_OUTPATIENT_CLINIC_OR_DEPARTMENT_OTHER): Payer: Self-pay

## 2012-12-03 ENCOUNTER — Emergency Department (HOSPITAL_BASED_OUTPATIENT_CLINIC_OR_DEPARTMENT_OTHER)
Admission: EM | Admit: 2012-12-03 | Discharge: 2012-12-03 | Disposition: A | Discharge status: Home / Self Care | Payer: BC Managed Care – PPO | Attending: Emergency Medicine | Admitting: Emergency Medicine

## 2012-12-03 DIAGNOSIS — G8929 Other chronic pain: Secondary | ICD-10-CM | POA: Insufficient documentation

## 2012-12-03 DIAGNOSIS — M549 Dorsalgia, unspecified: Secondary | ICD-10-CM

## 2012-12-03 DIAGNOSIS — Z79899 Other long term (current) drug therapy: Secondary | ICD-10-CM | POA: Insufficient documentation

## 2012-12-03 DIAGNOSIS — Z87798 Personal history of other (corrected) congenital malformations: Secondary | ICD-10-CM | POA: Insufficient documentation

## 2012-12-03 DIAGNOSIS — F329 Major depressive disorder, single episode, unspecified: Secondary | ICD-10-CM | POA: Insufficient documentation

## 2012-12-03 DIAGNOSIS — F410 Panic disorder [episodic paroxysmal anxiety] without agoraphobia: Secondary | ICD-10-CM | POA: Insufficient documentation

## 2012-12-03 DIAGNOSIS — F17200 Nicotine dependence, unspecified, uncomplicated: Secondary | ICD-10-CM | POA: Insufficient documentation

## 2012-12-03 DIAGNOSIS — R109 Unspecified abdominal pain: Secondary | ICD-10-CM | POA: Insufficient documentation

## 2012-12-03 DIAGNOSIS — F411 Generalized anxiety disorder: Secondary | ICD-10-CM | POA: Insufficient documentation

## 2012-12-03 DIAGNOSIS — M545 Low back pain, unspecified: Secondary | ICD-10-CM | POA: Insufficient documentation

## 2012-12-03 DIAGNOSIS — F419 Anxiety disorder, unspecified: Secondary | ICD-10-CM

## 2012-12-03 DIAGNOSIS — F3289 Other specified depressive episodes: Secondary | ICD-10-CM | POA: Insufficient documentation

## 2012-12-03 MED ORDER — TRAMADOL HCL 50 MG PO TABS: 50.0000 mg | ORAL_TABLET | Freq: Four times a day (QID) | ORAL | Status: DC | PRN

## 2012-12-03 NOTE — ED Provider Notes (Signed)
History     CSN: [id number]  Arrival date & time 12/03/12  1122   First MD Initiated Contact with Patient 12/03/12 1207      Chief Complaint  Patient presents with  . Panic Attack  . Back Pain    (Consider location/radiation/quality/duration/timing/severity/associated sxs/prior treatment) Patient is a 42 y.o. male presenting with back pain and anxiety. The history is provided by the patient. No language interpreter was used.  Back Pain Location:  Lumbar spine Associated symptoms: abdominal pain   Associated symptoms: no chest pain, no fever and no headaches   Associated symptoms comment:  History of spina bifida with chronic back pain. No new injury. No loss of bowel/bladder control. No numbness or falls.  Anxiety This is a chronic problem. Associated symptoms include abdominal pain. Pertinent negatives include no chest pain, fever, headaches, neck pain or vomiting. Associated symptoms comments: Typical symptoms of panic/anxiety. Currently seeing Dr. Caroline Sauger (PCP) but no psychologic or psychiatric follow up. Multiple ED visits for same. .    Past Medical History  Diagnosis Date  . Anxiety   . Spina bifida   . Depression     Past Surgical History  Procedure Laterality Date  . Back surgery      History reviewed. No pertinent family history.  History  Substance Use Topics  . Smoking status: Current Every Day Smoker -- 2.00 packs/day for 25 years    Types: Cigarettes  . Smokeless tobacco: Never Used  . Alcohol Use: No      Review of Systems  Constitutional: Negative for fever.  HENT: Negative for neck pain.   Respiratory: Negative for shortness of breath.   Cardiovascular: Negative for chest pain.  Gastrointestinal: Positive for abdominal pain. Negative for vomiting.  Genitourinary: Negative for difficulty urinating.  Musculoskeletal: Positive for back pain.  Neurological: Negative for headaches.  Psychiatric/Behavioral: Negative for suicidal ideas and  hallucinations. The patient is nervous/anxious.     Allergies  Review of patient's allergies indicates no known allergies.  Home Medications   Current Outpatient Rx  Name  Route  Sig  Dispense  Refill  . ALPRAZolam (XANAX) 1 MG tablet   Oral   Take 1 tablet (1 mg total) by mouth at bedtime as needed for sleep.   30 tablet   0   . LORazepam (ATIVAN) 1 MG tablet   Oral   Take 1 tablet (1 mg total) by mouth 3 (three) times daily as needed for anxiety.   12 tablet   0   . methadone (METHADOSE) 40 MG disintegrating tablet   Oral   Take 5 mg by mouth daily.          Marland Kitchen ALPRAZolam (XANAX PO)   Oral   Take by mouth 4 (four) times daily as needed.         Marland Kitchen LORazepam (ATIVAN) 1 MG tablet   Oral   Take 1 tablet (1 mg total) by mouth every 6 (six) hours as needed for anxiety.   10 tablet   0     BP 103/65  Pulse 88  Temp(Src) 98.3 F (36.8 C) (Oral)  Resp 16  Ht 6\' 4"  (1.93 m)  Wt 245 lb (111.131 kg)  BMI 29.83 kg/m2  SpO2 100%  Physical Exam  Constitutional: He is oriented to person, place, and time. He appears well-developed and well-nourished.  HENT:  Head: Normocephalic.  Neck: Normal range of motion. Neck supple.  Cardiovascular: Normal rate and regular rhythm.   Pulmonary/Chest: Effort  normal and breath sounds normal.  Abdominal: Soft. Bowel sounds are normal. There is no tenderness. There is no rebound and no guarding.  Musculoskeletal: Normal range of motion.  Lumbar midline and paraspinal tenderness. Well healed midline surgical scar over lumbar spine. No swelling, discoloration.  Neurological: He is alert and oriented to person, place, and time.  Skin: Skin is warm and dry. No rash noted.  Psychiatric: He has a normal mood and affect.  Flat affect. Oriented, poor eye contact. Clear speech.    ED Course  Procedures (including critical care time)  Labs Reviewed - No data to display No results found.   No diagnosis found.  1. Chronic back  pain 2. Chronic anxiety.  MDM  Controlled substances website consulted. #90 Xanax given on 5/15 for 30-day supply. No further prescriptions to be given in ED. He demonstrates red flags regarding drug seeking and addictive behavior. Will give Tramadol for pain. Encourage PCP follow up for management of chronic symptoms.        Arnoldo Hooker, PA-C 12/03/12 1233

## 2012-12-03 NOTE — ED Provider Notes (Signed)
Medical screening examination/treatment/procedure(s) were performed by non-physician practitioner and as supervising physician I was immediately available for consultation/collaboration.   Zeina Akkerman, MD 12/03/12 1329 

## 2012-12-03 NOTE — ED Notes (Signed)
Pt states that he has been having severe back pain and panic attacks because "I guess when you lay around in the bed all day you are going to have panic attacks."  Pt states that he is not suicidal or homicidal at this time but stated "if they don't soon get this pain under control I'm going to hurt myself."  Pt states that he has a few xanax left but "even though I take 4 a day I must be getting immune to them because it isn't working any more".

## 2012-12-13 ENCOUNTER — Encounter (HOSPITAL_BASED_OUTPATIENT_CLINIC_OR_DEPARTMENT_OTHER): Payer: Self-pay

## 2012-12-13 ENCOUNTER — Emergency Department (HOSPITAL_BASED_OUTPATIENT_CLINIC_OR_DEPARTMENT_OTHER)
Admission: EM | Admit: 2012-12-13 | Discharge: 2012-12-13 | Disposition: A | Discharge status: Home / Self Care | Payer: BC Managed Care – PPO | Attending: Emergency Medicine | Admitting: Emergency Medicine

## 2012-12-13 DIAGNOSIS — F17200 Nicotine dependence, unspecified, uncomplicated: Secondary | ICD-10-CM | POA: Insufficient documentation

## 2012-12-13 DIAGNOSIS — IMO0002 Reserved for concepts with insufficient information to code with codable children: Secondary | ICD-10-CM | POA: Insufficient documentation

## 2012-12-13 DIAGNOSIS — Z8776 Personal history of (corrected) congenital malformations of integument, limbs and musculoskeletal system: Secondary | ICD-10-CM | POA: Insufficient documentation

## 2012-12-13 DIAGNOSIS — Z8659 Personal history of other mental and behavioral disorders: Secondary | ICD-10-CM | POA: Insufficient documentation

## 2012-12-13 DIAGNOSIS — L02414 Cutaneous abscess of left upper limb: Secondary | ICD-10-CM

## 2012-12-13 DIAGNOSIS — Z87768 Personal history of other specified (corrected) congenital malformations of integument, limbs and musculoskeletal system: Secondary | ICD-10-CM | POA: Insufficient documentation

## 2012-12-13 MED ORDER — SULFAMETHOXAZOLE-TRIMETHOPRIM 800-160 MG PO TABS: 1.0000 | ORAL_TABLET | Freq: Two times a day (BID) | ORAL | Status: DC

## 2012-12-13 MED ORDER — HYDROCODONE-ACETAMINOPHEN 5-325 MG PO TABS
2.0000 | ORAL_TABLET | Freq: Once | ORAL | Status: AC
  Administered 2012-12-13: 2 via ORAL
  Filled 2012-12-13: qty 2

## 2012-12-13 MED ORDER — HYDROCODONE-ACETAMINOPHEN 5-325 MG PO TABS: 1.0000 | ORAL_TABLET | Freq: Once | ORAL | Status: DC

## 2012-12-13 NOTE — ED Notes (Signed)
I&D cart at bedside 

## 2012-12-13 NOTE — ED Notes (Signed)
Possible abscess on left arm

## 2012-12-13 NOTE — ED Provider Notes (Signed)
Medical screening examination/treatment/procedure(s) were performed by non-physician practitioner and as supervising physician I was immediately available for consultation/collaboration.  Ethelda Chick, MD 12/13/12 1340

## 2012-12-13 NOTE — ED Notes (Signed)
Shari, PA-C at bedside. 

## 2012-12-13 NOTE — ED Provider Notes (Signed)
History     CSN: [id number]  Arrival date & time 12/13/12  1228   First MD Initiated Contact with Patient 12/13/12 1256      Chief Complaint  Patient presents with  . Abscess    (Consider location/radiation/quality/duration/timing/severity/associated sxs/prior treatment) Patient is a 42 y.o. male presenting with abscess. The history is provided by the patient. No language interpreter was used.  Abscess Location:  Shoulder/arm Shoulder/arm abscess location:  L arm Abscess quality: draining   Associated symptoms: no fever   Associated symptoms comment:  Painful swollen area that he has attempted multiple times to open himself, is worse today. More painful. No fever. No history of abscess.    Past Medical History  Diagnosis Date  . Anxiety   . Spina bifida   . Depression     Past Surgical History  Procedure Laterality Date  . Back surgery      No family history on file.  History  Substance Use Topics  . Smoking status: Current Every Day Smoker -- 2.00 packs/day for 25 years    Types: Cigarettes  . Smokeless tobacco: Never Used  . Alcohol Use: No      Review of Systems  Constitutional: Negative for fever.  Musculoskeletal: Negative for myalgias.  Skin:       See HPI. C/O abscess.    Allergies  Review of patient's allergies indicates no known allergies.  Home Medications   Current Outpatient Rx  Name  Route  Sig  Dispense  Refill  . traMADol (ULTRAM) 50 MG tablet   Oral   Take 1 tablet (50 mg total) by mouth every 6 (six) hours as needed for pain.   15 tablet   0     BP 128/79  Pulse 90  Temp(Src) 98.6 F (37 C) (Oral)  Resp 18  Ht 6\' 4"  (1.93 m)  Wt 240 lb (108.863 kg)  BMI 29.23 kg/m2  Physical Exam  Constitutional: He is oriented to person, place, and time. He appears well-developed and well-nourished.  Neck: Normal range of motion.  Pulmonary/Chest: Effort normal.  Musculoskeletal: Normal range of motion.  Neurological: He is alert  and oriented to person, place, and time.  Skin: Skin is warm and dry.  Large, red, indurated lesion with central ulceration left AC arm. No streaking or extension of redness from immediate area. FROM of arm.  Psychiatric: He has a normal mood and affect.    ED Course  Procedures (including critical care time)  Labs Reviewed - No data to display No results found.   No diagnosis found.  1. Cutaneous abscess left arm  MDM  Uncomplicated cutaneous abscess.    INCISION AND DRAINAGE Performed by: Elpidio Anis A Consent: Verbal consent obtained. Risks and benefits: risks, benefits and alternatives were discussed Type: abscess  Body area: left arm  Anesthesia: local infiltration  Incision was made with a scalpel.  Local anesthetic: lidocaine 2% w/o epinephrine  Anesthetic total: 2 ml  Complexity: complex Blunt dissection to break up loculations  Drainage: purulent  Drainage amount: small  Packing material: 1/4 in iodoform gauze  Patient tolerance: Patient tolerated the procedure well with no immediate complications.           Arnoldo Hooker, PA-C 12/13/12 1334

## 2012-12-15 ENCOUNTER — Encounter (HOSPITAL_BASED_OUTPATIENT_CLINIC_OR_DEPARTMENT_OTHER): Payer: Self-pay

## 2012-12-15 ENCOUNTER — Emergency Department (HOSPITAL_BASED_OUTPATIENT_CLINIC_OR_DEPARTMENT_OTHER)
Admission: EM | Admit: 2012-12-15 | Discharge: 2012-12-15 | Disposition: A | Discharge status: Home / Self Care | Payer: BC Managed Care – PPO | Attending: Emergency Medicine | Admitting: Emergency Medicine

## 2012-12-15 DIAGNOSIS — F329 Major depressive disorder, single episode, unspecified: Secondary | ICD-10-CM | POA: Insufficient documentation

## 2012-12-15 DIAGNOSIS — Z8739 Personal history of other diseases of the musculoskeletal system and connective tissue: Secondary | ICD-10-CM | POA: Insufficient documentation

## 2012-12-15 DIAGNOSIS — F411 Generalized anxiety disorder: Secondary | ICD-10-CM | POA: Insufficient documentation

## 2012-12-15 DIAGNOSIS — Z4802 Encounter for removal of sutures: Secondary | ICD-10-CM | POA: Insufficient documentation

## 2012-12-15 DIAGNOSIS — F3289 Other specified depressive episodes: Secondary | ICD-10-CM | POA: Insufficient documentation

## 2012-12-15 DIAGNOSIS — Z5189 Encounter for other specified aftercare: Secondary | ICD-10-CM

## 2012-12-15 DIAGNOSIS — L02419 Cutaneous abscess of limb, unspecified: Secondary | ICD-10-CM

## 2012-12-15 DIAGNOSIS — F17200 Nicotine dependence, unspecified, uncomplicated: Secondary | ICD-10-CM | POA: Insufficient documentation

## 2012-12-15 MED ORDER — HYDROCODONE-ACETAMINOPHEN 5-325 MG PO TABS: 2.0000 | ORAL_TABLET | ORAL | Status: DC | PRN

## 2012-12-15 NOTE — ED Notes (Signed)
Here for recheck of wound left arm

## 2012-12-15 NOTE — ED Notes (Signed)
When dry gauze removed, packing came out of wound with gauze, purulent, small amount of blood.

## 2012-12-15 NOTE — ED Provider Notes (Signed)
History     CSN: [id number]  Arrival date & time 12/15/12  5784   First MD Initiated Contact with Patient 12/15/12 [phone]      Chief Complaint  Patient presents with  . Wound Check    (Consider location/radiation/quality/duration/timing/severity/associated sxs/prior treatment) HPI Comments: Patient returns for a wound check of left antecubital abscess.  Was seen here and had I and D performed two days ago.  He continues to complain of pain and is "out of his pain pills".    Patient is a 42 y.o. male presenting with wound check. The history is provided by the patient.  Wound Check This is a new problem. The current episode started 2 days ago. The problem occurs constantly. The problem has not changed since onset.Nothing relieves the symptoms. He has tried nothing for the symptoms. The treatment provided no relief.    Past Medical History  Diagnosis Date  . Anxiety   . Spina bifida   . Depression     Past Surgical History  Procedure Laterality Date  . Back surgery      No family history on file.  History  Substance Use Topics  . Smoking status: Current Every Day Smoker -- 2.00 packs/day for 25 years    Types: Cigarettes  . Smokeless tobacco: Never Used  . Alcohol Use: No      Review of Systems  All other systems reviewed and are negative.    Allergies  Review of patient's allergies indicates no known allergies.  Home Medications   Current Outpatient Rx  Name  Route  Sig  Dispense  Refill  . HYDROcodone-acetaminophen (NORCO/VICODIN) 5-325 MG per tablet   Oral   Take 1-2 tablets by mouth once.   8 tablet   0   . sulfamethoxazole-trimethoprim (SEPTRA DS) 800-160 MG per tablet   Oral   Take 1 tablet by mouth every 12 (twelve) hours.   10 tablet   0   . traMADol (ULTRAM) 50 MG tablet   Oral   Take 1 tablet (50 mg total) by mouth every 6 (six) hours as needed for pain.   15 tablet   0     BP 128/90  Pulse 90  Temp(Src) 100.1 F (37.8 C) (Oral)   Resp 20  SpO2 96%  Physical Exam  Nursing note and vitals reviewed. Constitutional: He is oriented to person, place, and time. He appears well-developed and well-nourished. No distress.  HENT:  Head: Normocephalic and atraumatic.  Mouth/Throat: Oropharynx is clear and moist.  Neck: Normal range of motion. Neck supple.  Musculoskeletal: Normal range of motion.  Neurological: He is alert and oriented to person, place, and time.  Skin: Skin is warm and dry. He is not diaphoretic.  The left antecubital region is noted to have an area of recently incised abscess.  There is mild surrounding erythema and slight serous drainage.  There is pain with palpation and movement of the elbow.    ED Course  Procedures (including critical care time)  Labs Reviewed - No data to display No results found.   No diagnosis found.    MDM  The abscess appears to be healing well and I have recommended he continue his bactrim and warm soaks.  He requests more pain medication and also handed me an empty bottle of xanax requesting a refill of this as well.  I have agreed to write him for a few more hydrocodone, however I will not fill his xanax.  He has had multiple  refills of this here in the past and I do not feel as though this is appropriate to continue this here, nor do I feel as though mixing narcotics and benzodiazepenes is a good idea.  He will need to see his pcp for his xanax.          Geoffery Lyons, MD 12/15/12 [phone]

## 2013-01-01 ENCOUNTER — Encounter (HOSPITAL_BASED_OUTPATIENT_CLINIC_OR_DEPARTMENT_OTHER): Payer: Self-pay | Admitting: *Deleted

## 2013-01-01 ENCOUNTER — Emergency Department (HOSPITAL_BASED_OUTPATIENT_CLINIC_OR_DEPARTMENT_OTHER): Payer: BC Managed Care – PPO

## 2013-01-01 ENCOUNTER — Emergency Department (HOSPITAL_BASED_OUTPATIENT_CLINIC_OR_DEPARTMENT_OTHER)
Admission: EM | Admit: 2013-01-01 | Discharge: 2013-01-01 | Disposition: A | Discharge status: Home / Self Care | Payer: BC Managed Care – PPO | Attending: Emergency Medicine | Admitting: Emergency Medicine

## 2013-01-01 DIAGNOSIS — IMO0002 Reserved for concepts with insufficient information to code with codable children: Secondary | ICD-10-CM | POA: Insufficient documentation

## 2013-01-01 DIAGNOSIS — M549 Dorsalgia, unspecified: Secondary | ICD-10-CM | POA: Insufficient documentation

## 2013-01-01 DIAGNOSIS — F17200 Nicotine dependence, unspecified, uncomplicated: Secondary | ICD-10-CM | POA: Insufficient documentation

## 2013-01-01 DIAGNOSIS — R51 Headache: Secondary | ICD-10-CM | POA: Insufficient documentation

## 2013-01-01 DIAGNOSIS — F411 Generalized anxiety disorder: Secondary | ICD-10-CM | POA: Insufficient documentation

## 2013-01-01 DIAGNOSIS — F3289 Other specified depressive episodes: Secondary | ICD-10-CM | POA: Insufficient documentation

## 2013-01-01 DIAGNOSIS — R5381 Other malaise: Secondary | ICD-10-CM | POA: Insufficient documentation

## 2013-01-01 DIAGNOSIS — L02419 Cutaneous abscess of limb, unspecified: Secondary | ICD-10-CM

## 2013-01-01 DIAGNOSIS — R42 Dizziness and giddiness: Secondary | ICD-10-CM | POA: Insufficient documentation

## 2013-01-01 DIAGNOSIS — R5383 Other fatigue: Secondary | ICD-10-CM | POA: Insufficient documentation

## 2013-01-01 DIAGNOSIS — Z79899 Other long term (current) drug therapy: Secondary | ICD-10-CM | POA: Insufficient documentation

## 2013-01-01 DIAGNOSIS — F329 Major depressive disorder, single episode, unspecified: Secondary | ICD-10-CM | POA: Insufficient documentation

## 2013-01-01 DIAGNOSIS — Z9889 Other specified postprocedural states: Secondary | ICD-10-CM | POA: Insufficient documentation

## 2013-01-01 MED ORDER — CEPHALEXIN 500 MG PO CAPS: 500.0000 mg | ORAL_CAPSULE | Freq: Four times a day (QID) | ORAL | Status: DC

## 2013-01-01 MED ORDER — TRAMADOL HCL 50 MG PO TABS: 50.0000 mg | ORAL_TABLET | Freq: Four times a day (QID) | ORAL | Status: DC | PRN

## 2013-01-01 MED ORDER — PROMETHAZINE HCL 25 MG/ML IJ SOLN
25.0000 mg | Freq: Once | INTRAMUSCULAR | Status: AC
  Administered 2013-01-01: 25 mg via INTRAMUSCULAR
  Filled 2013-01-01: qty 1

## 2013-01-01 MED ORDER — KETOROLAC TROMETHAMINE 60 MG/2ML IM SOLN
60.0000 mg | Freq: Once | INTRAMUSCULAR | Status: AC
  Administered 2013-01-01: 60 mg via INTRAMUSCULAR
  Filled 2013-01-01: qty 2

## 2013-01-01 MED ORDER — SULFAMETHOXAZOLE-TRIMETHOPRIM 800-160 MG PO TABS: 1.0000 | ORAL_TABLET | Freq: Two times a day (BID) | ORAL | Status: DC

## 2013-01-01 NOTE — ED Provider Notes (Signed)
History    CSN: [id number] Arrival date & time 01/01/13  1005  First MD Initiated Contact with Patient 01/01/13 1009     No chief complaint on file.  (Consider location/radiation/quality/duration/timing/severity/associated sxs/prior Treatment) HPI Comments: The patient presents here with multiple complaints, including back pain, headache for a month, pain in the left arm where he recently had a antecubital abscess drained.  He says that this is making it difficult for him to get any sleep.  No fevers or chills.  No alleviating factors.  He reports having taken tylenol without relief.    He also states he is "treated like a drug addict" every time he comes here and wants to take a drug test right now to clear this up.  The history is provided by the patient.   Past Medical History  Diagnosis Date  . Anxiety   . Spina bifida   . Depression    Past Surgical History  Procedure Laterality Date  . Back surgery     No family history on file. History  Substance Use Topics  . Smoking status: Current Every Day Smoker -- 2.00 packs/day for 25 years    Types: Cigarettes  . Smokeless tobacco: Never Used  . Alcohol Use: No    Review of Systems  Constitutional: Positive for fatigue. Negative for fever and chills.  Eyes: Negative for visual disturbance.  Respiratory: Negative for cough.   Cardiovascular: Negative for chest pain.  Musculoskeletal: Positive for back pain.  Neurological: Positive for light-headedness and headaches. Negative for weakness.  All other systems reviewed and are negative.    Allergies  Review of patient's allergies indicates no known allergies.  Home Medications   Current Outpatient Rx  Name  Route  Sig  Dispense  Refill  . HYDROcodone-acetaminophen (NORCO) 5-325 MG per tablet   Oral   Take 2 tablets by mouth every 4 (four) hours as needed for pain.   10 tablet   0   . HYDROcodone-acetaminophen (NORCO/VICODIN) 5-325 MG per tablet   Oral   Take  1-2 tablets by mouth once.   8 tablet   0   . sulfamethoxazole-trimethoprim (SEPTRA DS) 800-160 MG per tablet   Oral   Take 1 tablet by mouth every 12 (twelve) hours.   10 tablet   0   . traMADol (ULTRAM) 50 MG tablet   Oral   Take 1 tablet (50 mg total) by mouth every 6 (six) hours as needed for pain.   15 tablet   0    BP 132/80  Pulse 115  Temp(Src) 99.2 F (37.3 C) (Oral)  Resp 20  Ht 6\' 4"  (1.93 m)  Wt 245 lb (111.131 kg)  BMI 29.83 kg/m2  SpO2 99% Physical Exam  Nursing note and vitals reviewed. Constitutional: He is oriented to person, place, and time. He appears well-developed and well-nourished. No distress.  HENT:  Head: Normocephalic and atraumatic.  Mouth/Throat: Oropharynx is clear and moist.  Eyes: EOM are normal. Pupils are equal, round, and reactive to light.  There is no papilledema upon fundoscopic exam.  Neck: Normal range of motion. Neck supple.  Cardiovascular: Normal rate and regular rhythm.   No murmur heard. Pulmonary/Chest: Effort normal and breath sounds normal. No respiratory distress.  Abdominal: Soft. Bowel sounds are normal. He exhibits no distension.  Musculoskeletal: Normal range of motion. He exhibits no edema.  The wound to the left antecubital fossa appears to be healing well.  I fell no warmth, see no erythema  other than granulation tissue, and there is no fluctuance.  He has full range of motion of the elbow.  Lymphadenopathy:    He has no cervical adenopathy.  Neurological: He is alert and oriented to person, place, and time.  Skin: Skin is warm and dry. He is not diaphoretic.    ED Course  Procedures (including critical care time) Labs Reviewed - No data to display No results found. No diagnosis found.  MDM  The patient presents with multiple painful conditions, none of which seem acute.  The most concerning complaint he had was headache, for which he was given toradol and a ct scan of the head was performed.  This was  unremarkable and he is neurologically intact and I do not feel as though an LP is indicated.  He also is concerned about the abscess that was recently drained re-accumulating.  I see nothing to suggest this or warrant I and D.  I have offered a prescription for tramadol for his pain and have advised him to give these conditions time to resolve.   He seems to frequent the ED and has received multiple prescriptions for pain medications in the recent past, and expresses anger that he has been treated "like a drug seeker in the past".   He will be discharged to home, to follow up prn.        Geoffery Lyons, MD 01/02/13 [phone]

## 2013-01-04 ENCOUNTER — Emergency Department (HOSPITAL_COMMUNITY)
Admission: EM | Admit: 2013-01-04 | Discharge: 2013-01-04 | Disposition: A | Discharge status: Home / Self Care | Payer: BC Managed Care – PPO | Attending: Emergency Medicine | Admitting: Emergency Medicine

## 2013-01-04 ENCOUNTER — Encounter (HOSPITAL_COMMUNITY): Payer: Self-pay | Admitting: Emergency Medicine

## 2013-01-04 DIAGNOSIS — F329 Major depressive disorder, single episode, unspecified: Secondary | ICD-10-CM | POA: Insufficient documentation

## 2013-01-04 DIAGNOSIS — Z8776 Personal history of (corrected) congenital malformations of integument, limbs and musculoskeletal system: Secondary | ICD-10-CM | POA: Insufficient documentation

## 2013-01-04 DIAGNOSIS — F411 Generalized anxiety disorder: Secondary | ICD-10-CM | POA: Insufficient documentation

## 2013-01-04 DIAGNOSIS — R5381 Other malaise: Secondary | ICD-10-CM | POA: Insufficient documentation

## 2013-01-04 DIAGNOSIS — R51 Headache: Secondary | ICD-10-CM | POA: Insufficient documentation

## 2013-01-04 DIAGNOSIS — R6883 Chills (without fever): Secondary | ICD-10-CM | POA: Insufficient documentation

## 2013-01-04 DIAGNOSIS — Z87768 Personal history of other specified (corrected) congenital malformations of integument, limbs and musculoskeletal system: Secondary | ICD-10-CM | POA: Insufficient documentation

## 2013-01-04 DIAGNOSIS — IMO0002 Reserved for concepts with insufficient information to code with codable children: Secondary | ICD-10-CM | POA: Insufficient documentation

## 2013-01-04 DIAGNOSIS — F3289 Other specified depressive episodes: Secondary | ICD-10-CM | POA: Insufficient documentation

## 2013-01-04 DIAGNOSIS — F17200 Nicotine dependence, unspecified, uncomplicated: Secondary | ICD-10-CM | POA: Insufficient documentation

## 2013-01-04 DIAGNOSIS — L02414 Cutaneous abscess of left upper limb: Secondary | ICD-10-CM

## 2013-01-04 DIAGNOSIS — IMO0001 Reserved for inherently not codable concepts without codable children: Secondary | ICD-10-CM | POA: Insufficient documentation

## 2013-01-04 MED ORDER — OXYCODONE-ACETAMINOPHEN 5-325 MG PO TABS
1.0000 | ORAL_TABLET | Freq: Once | ORAL | Status: AC
  Administered 2013-01-04: 1 via ORAL
  Filled 2013-01-04: qty 1

## 2013-01-04 NOTE — ED Provider Notes (Signed)
Medical screening examination/treatment/procedure(s) were conducted as a shared visit with non-physician practitioner(s) and myself.  I personally evaluated the patient during the encounter  This appears to be an area of induration of his left lateral antecubital fossa.  It sounds as though there was a large amount of drainage that he is able to drain yesterday.  He may need formal incision and drainage.  I discussed his case with Dr. Onalee Hua of hand surgery who agrees to see the patient in the office now.  N.p.o.  Lyanne Co, MD 01/04/13 [phone]

## 2013-01-04 NOTE — ED Notes (Signed)
Pt states he has had cyst on arm for past month.  Was seen several days ago for same.  Pt states cyst is not getting better and has swollen up again.  Pt states he has been taking abx.  Pt states yesterday he drained abscess at home.  Pt states he also feels lethargic and has had ha since wound began.

## 2013-01-04 NOTE — ED Notes (Signed)
MD at bedside. 

## 2013-01-04 NOTE — ED Provider Notes (Signed)
History    CSN: [id number] Arrival date & time 01/04/13  4098  First MD Initiated Contact with Patient 01/04/13 [phone]     Chief Complaint  Patient presents with  . Arm Pain  . Cyst   (Consider location/radiation/quality/duration/timing/severity/associated sxs/prior Treatment) HPI Pt is a 42yo male presenting with 4 week hx of left elbow pain, swelling and redness.  States it started after working, lifting heavy boxes and cut it on a razor blade, causing an infection.  States he was seen multiple times at [facility] for I&D.  Records show he was placed on bactrim and keflex but states it has not healed yet and continues to get worse. States yesterday the area swelled the size of a golf ball with 10/10 sharp, throbbing pain so he cut open the abscess himself, blood and purulent drainage "flew" out of his arm.  Pt states some relief after drainage however he feels very fatigued and has been getting headaches ever since abscess started. Unknown fevers. Denies n/v/d. Denies hx of DM or other immunocompromise however does smoke.    Past Medical History  Diagnosis Date  . Anxiety   . Spina bifida   . Depression    Past Surgical History  Procedure Laterality Date  . Back surgery     History reviewed. No pertinent family history. History  Substance Use Topics  . Smoking status: Current Every Day Smoker -- 2.00 packs/day for 25 years    Types: Cigarettes  . Smokeless tobacco: Never Used  . Alcohol Use: No    Review of Systems  Constitutional: Positive for chills and fatigue. Negative for fever.  Musculoskeletal: Positive for myalgias, joint swelling and arthralgias. Negative for back pain and gait problem.  Skin: Positive for rash and wound.       Left elbow     Allergies  Review of patient's allergies indicates no known allergies.  Home Medications   Current Outpatient Rx  Name  Route  Sig  Dispense  Refill  . cephALEXin (KEFLEX) 500 MG capsule   Oral   Take 1  capsule (500 mg total) by mouth 4 (four) times daily.   28 capsule   0   . sulfamethoxazole-trimethoprim (SEPTRA DS) 800-160 MG per tablet   Oral   Take 1 tablet by mouth every 12 (twelve) hours.   14 tablet   0   . traMADol (ULTRAM) 50 MG tablet   Oral   Take 1 tablet (50 mg total) by mouth every 6 (six) hours as needed for pain.   15 tablet   0    BP 130/80  Pulse 99  Temp(Src) 98.5 F (36.9 C) (Oral)  Resp 16  SpO2 100% Physical Exam  Nursing note and vitals reviewed. Constitutional: He appears well-developed and well-nourished. No distress.  Pt sitting in chair in exam room. Visible left arm abscess. Pt in NAD.  HENT:  Head: Normocephalic and atraumatic.  Eyes: Conjunctivae are normal. No scleral icterus.  Neck: Normal range of motion.  Cardiovascular: Normal rate, regular rhythm and normal heart sounds.   Pulmonary/Chest: Effort normal and breath sounds normal. No respiratory distress. He has no wheezes. He has no rales. He exhibits no tenderness.  Musculoskeletal: He exhibits edema and tenderness ( left antecubital space).       Left elbow: He exhibits decreased range of motion (175 degree left elbow extension) and swelling. Tenderness found. Lateral epicondyle ( closer to anticubital space) tenderness noted.       Arms:  Erythema, induration and warmth of left antecubital space.  TTP. Pinpoint opening draining scant serous anginous fluid.    Neurological: He is alert.  Skin: Skin is warm and dry. He is not diaphoretic.  Psychiatric: He has a normal mood and affect. His behavior is normal.    ED Course  Procedures (including critical care time) Labs Reviewed - No data to display No results found. 1. Abscess of left arm     MDM  Pt has been tx over the past 4 weeks for left arm abscess in antecubital space.  Discussed pt with Dr. Patria Mane who is consulting Dr. Amanda Pea.  Do not believe infection has entered joint yet, however, pt has failed I&D and tx with keflex and  septra.  Dr. Amanda Pea agrees to see pt in office today for further evaluation and tx of abscess. Return precautions given. Pt verbalized understanding and agreement with tx plan. Vitals: unremarkable. Discharged in stable condition.    Discussed pt with attending during ED encounter.   Junius Finner, PA-C 01/04/13 [phone]

## 2013-01-06 ENCOUNTER — Emergency Department (HOSPITAL_COMMUNITY)
Admission: EM | Admit: 2013-01-06 | Discharge: 2013-01-06 | Disposition: A | Discharge status: Home / Self Care | Payer: BC Managed Care – PPO | Attending: Emergency Medicine | Admitting: Emergency Medicine

## 2013-01-06 ENCOUNTER — Encounter (HOSPITAL_COMMUNITY): Payer: Self-pay | Admitting: Emergency Medicine

## 2013-01-06 DIAGNOSIS — Z79899 Other long term (current) drug therapy: Secondary | ICD-10-CM | POA: Insufficient documentation

## 2013-01-06 DIAGNOSIS — F17200 Nicotine dependence, unspecified, uncomplicated: Secondary | ICD-10-CM | POA: Insufficient documentation

## 2013-01-06 DIAGNOSIS — F411 Generalized anxiety disorder: Secondary | ICD-10-CM | POA: Insufficient documentation

## 2013-01-06 DIAGNOSIS — F3289 Other specified depressive episodes: Secondary | ICD-10-CM | POA: Insufficient documentation

## 2013-01-06 DIAGNOSIS — Z87798 Personal history of other (corrected) congenital malformations: Secondary | ICD-10-CM | POA: Insufficient documentation

## 2013-01-06 DIAGNOSIS — S060X0A Concussion without loss of consciousness, initial encounter: Secondary | ICD-10-CM

## 2013-01-06 DIAGNOSIS — F329 Major depressive disorder, single episode, unspecified: Secondary | ICD-10-CM | POA: Insufficient documentation

## 2013-01-06 DIAGNOSIS — S0993XA Unspecified injury of face, initial encounter: Secondary | ICD-10-CM | POA: Insufficient documentation
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
## 2013-01-07 ENCOUNTER — Encounter (HOSPITAL_BASED_OUTPATIENT_CLINIC_OR_DEPARTMENT_OTHER): Payer: Self-pay | Admitting: *Deleted

## 2013-01-07 ENCOUNTER — Emergency Department (HOSPITAL_BASED_OUTPATIENT_CLINIC_OR_DEPARTMENT_OTHER)
Admission: EM | Admit: 2013-01-07 | Discharge: 2013-01-07 | Disposition: A | Discharge status: Home / Self Care | Payer: BC Managed Care – PPO | Attending: Emergency Medicine | Admitting: Emergency Medicine

## 2013-01-07 DIAGNOSIS — Z8659 Personal history of other mental and behavioral disorders: Secondary | ICD-10-CM | POA: Insufficient documentation

## 2013-01-07 DIAGNOSIS — F17200 Nicotine dependence, unspecified, uncomplicated: Secondary | ICD-10-CM | POA: Insufficient documentation

## 2013-01-07 DIAGNOSIS — R110 Nausea: Secondary | ICD-10-CM | POA: Insufficient documentation

## 2013-01-07 DIAGNOSIS — Z87798 Personal history of other (corrected) congenital malformations: Secondary | ICD-10-CM | POA: Insufficient documentation

## 2013-01-07 DIAGNOSIS — R51 Headache: Secondary | ICD-10-CM | POA: Insufficient documentation

## 2013-01-07 MED ORDER — KETOROLAC TROMETHAMINE 60 MG/2ML IM SOLN
60.0000 mg | Freq: Once | INTRAMUSCULAR | Status: AC
  Administered 2013-01-07: 60 mg via INTRAMUSCULAR
  Filled 2013-01-07: qty 2

## 2013-01-07 MED ORDER — PROMETHAZINE HCL 25 MG/ML IJ SOLN
25.0000 mg | Freq: Once | INTRAMUSCULAR | Status: AC
  Administered 2013-01-07: 25 mg via INTRAMUSCULAR
  Filled 2013-01-07: qty 1

## 2013-01-07 MED ORDER — TRAMADOL HCL 50 MG PO TABS: 50.0000 mg | ORAL_TABLET | Freq: Four times a day (QID) | ORAL | Status: DC | PRN

## 2013-01-07 NOTE — ED Notes (Signed)
Migraine x 4 weeks. Taking Tyl ES and Aleve without relief.
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
## 2013-01-20 ENCOUNTER — Encounter (HOSPITAL_COMMUNITY): Payer: Self-pay | Admitting: Emergency Medicine

## 2013-01-20 ENCOUNTER — Emergency Department (HOSPITAL_COMMUNITY)
Admission: EM | Admit: 2013-01-20 | Discharge: 2013-01-20 | Disposition: A | Discharge status: Home / Self Care | Payer: BC Managed Care – PPO | Attending: Emergency Medicine | Admitting: Emergency Medicine

## 2013-01-20 DIAGNOSIS — F3289 Other specified depressive episodes: Secondary | ICD-10-CM | POA: Insufficient documentation

## 2013-01-20 DIAGNOSIS — H53149 Visual discomfort, unspecified: Secondary | ICD-10-CM | POA: Insufficient documentation

## 2013-01-20 DIAGNOSIS — F17200 Nicotine dependence, unspecified, uncomplicated: Secondary | ICD-10-CM | POA: Insufficient documentation

## 2013-01-20 DIAGNOSIS — Z8679 Personal history of other diseases of the circulatory system: Secondary | ICD-10-CM | POA: Insufficient documentation

## 2013-01-20 DIAGNOSIS — R51 Headache: Secondary | ICD-10-CM | POA: Insufficient documentation

## 2013-01-20 DIAGNOSIS — Z79899 Other long term (current) drug therapy: Secondary | ICD-10-CM | POA: Insufficient documentation

## 2013-01-20 DIAGNOSIS — F329 Major depressive disorder, single episode, unspecified: Secondary | ICD-10-CM | POA: Insufficient documentation

## 2013-01-20 DIAGNOSIS — Z8669 Personal history of other diseases of the nervous system and sense organs: Secondary | ICD-10-CM | POA: Insufficient documentation

## 2013-01-20 DIAGNOSIS — F411 Generalized anxiety disorder: Secondary | ICD-10-CM | POA: Insufficient documentation

## 2013-01-20 MED ORDER — DEXAMETHASONE SODIUM PHOSPHATE 10 MG/ML IJ SOLN
10.0000 mg | Freq: Once | INTRAMUSCULAR | Status: AC
  Administered 2013-01-20: 10 mg via INTRAMUSCULAR
  Filled 2013-01-20: qty 1

## 2013-01-20 MED ORDER — KETOROLAC TROMETHAMINE 60 MG/2ML IM SOLN
60.0000 mg | Freq: Once | INTRAMUSCULAR | Status: AC
  Administered 2013-01-20: 60 mg via INTRAMUSCULAR
  Filled 2013-01-20: qty 2

## 2013-01-20 MED ORDER — METOCLOPRAMIDE HCL 5 MG/ML IJ SOLN
10.0000 mg | Freq: Once | INTRAMUSCULAR | Status: AC
  Administered 2013-01-20: 10 mg via INTRAMUSCULAR
  Filled 2013-01-20: qty 2

## 2013-01-20 MED ORDER — DIPHENHYDRAMINE HCL 50 MG/ML IJ SOLN
25.0000 mg | Freq: Once | INTRAMUSCULAR | Status: AC
  Administered 2013-01-20: 25 mg via INTRAMUSCULAR
  Filled 2013-01-20: qty 1

## 2013-01-20 NOTE — ED Notes (Signed)
Pt alert and oriented x4. Respirations even and unlabored, bilateral symmetrical rise and fall of chest. Skin warm and dry. In no acute distress. Denies needs.   

## 2013-01-20 NOTE — ED Notes (Signed)
Pt c/o HA x 5 weeks, pt states he has been seen mulitple times for same. Pt states he has appt with neuro this week. +n/v, +photophobia, +phonophobia. PWD

## 2013-01-20 NOTE — ED Provider Notes (Signed)
History    CSN: [id number] Arrival date & time 01/20/13  4098  First MD Initiated Contact with Patient 01/20/13 [phone]     Chief Complaint  Patient presents with  . Headache   (Consider location/radiation/quality/duration/timing/severity/associated sxs/prior Treatment) Patient is a 42 y.o. male presenting with headaches. The history is provided by the patient.  Headache  patient here with headache x5 weeks. Seen in the department x2 as well as other facilities diagnosed with migraines. Has had imaging of his brain. Scheduled to see the neurologist in 5 days. Location of headache is the right side of his head. No visual changes. Some nausea with occasional vomiting. Denies any dizziness when standing. Does note phonophobia and photophobia without fever chills or neck pain. Has taken Ultram without relief. Past Medical History  Diagnosis Date  . Anxiety   . Spina bifida   . Depression    Past Surgical History  Procedure Laterality Date  . Back surgery     No family history on file. History  Substance Use Topics  . Smoking status: Current Every Day Smoker -- 2.00 packs/day for 25 years    Types: Cigarettes  . Smokeless tobacco: Never Used  . Alcohol Use: No    Review of Systems  Neurological: Positive for headaches.  All other systems reviewed and are negative.    Allergies  Review of patient's allergies indicates no known allergies.  Home Medications   Current Outpatient Rx  Name  Route  Sig  Dispense  Refill  . cephALEXin (KEFLEX) 500 MG capsule   Oral   Take 1 capsule (500 mg total) by mouth 4 (four) times daily.   28 capsule   0   . sulfamethoxazole-trimethoprim (SEPTRA DS) 800-160 MG per tablet   Oral   Take 1 tablet by mouth every 12 (twelve) hours.   14 tablet   0   . traMADol (ULTRAM) 50 MG tablet   Oral   Take 1 tablet (50 mg total) by mouth every 6 (six) hours as needed for pain.   15 tablet   0   . traMADol (ULTRAM) 50 MG tablet   Oral   Take  1 tablet (50 mg total) by mouth every 6 (six) hours as needed for pain.   12 tablet   0    BP 98/73  Pulse 84  Temp(Src) 98.7 F (37.1 C) (Oral)  Resp 15  SpO2 98% Physical Exam  Nursing note and vitals reviewed. Constitutional: He is oriented to person, place, and time. He appears well-developed and well-nourished.  Non-toxic appearance. No distress.  HENT:  Head: Normocephalic and atraumatic.  Eyes: Conjunctivae, EOM and lids are normal. Pupils are equal, round, and reactive to light.  Neck: Normal range of motion. Neck supple. No tracheal deviation present. No mass present.  Cardiovascular: Normal rate, regular rhythm and normal heart sounds.  Exam reveals no gallop.   No murmur heard. Pulmonary/Chest: Effort normal and breath sounds normal. No stridor. No respiratory distress. He has no decreased breath sounds. He has no wheezes. He has no rhonchi. He has no rales.  Abdominal: Soft. Normal appearance and bowel sounds are normal. He exhibits no distension. There is no tenderness. There is no rebound and no CVA tenderness.  Musculoskeletal: Normal range of motion. He exhibits no edema and no tenderness.  Neurological: He is alert and oriented to person, place, and time. He has normal strength. No cranial nerve deficit or sensory deficit. GCS eye subscore is 4. GCS verbal subscore  is 5. GCS motor subscore is 6.  Skin: Skin is warm and dry. No abrasion and no rash noted.  Psychiatric: He has a normal mood and affect. His speech is normal and behavior is normal.    ED Course  Procedures (including critical care time) Labs Reviewed - No data to display No results found. No diagnosis found.  MDM  Pt given meds and feels better. Patient encouraged to keep his appointment with his neurologist next week  Toy Baker, MD 01/20/13 [phone]

## 2013-01-20 NOTE — ED Notes (Signed)
Pt escorted to discharge window. Pt verbalized understanding discharge instructions. In no acute distress.  

## 2013-01-24 ENCOUNTER — Encounter (HOSPITAL_BASED_OUTPATIENT_CLINIC_OR_DEPARTMENT_OTHER): Payer: Self-pay

## 2013-01-24 ENCOUNTER — Emergency Department (HOSPITAL_BASED_OUTPATIENT_CLINIC_OR_DEPARTMENT_OTHER)
Admission: EM | Admit: 2013-01-24 | Discharge: 2013-01-24 | Disposition: A | Discharge status: Home / Self Care | Payer: BC Managed Care – PPO | Attending: Emergency Medicine | Admitting: Emergency Medicine

## 2013-01-24 DIAGNOSIS — H53149 Visual discomfort, unspecified: Secondary | ICD-10-CM | POA: Insufficient documentation

## 2013-01-24 DIAGNOSIS — F17200 Nicotine dependence, unspecified, uncomplicated: Secondary | ICD-10-CM | POA: Insufficient documentation

## 2013-01-24 DIAGNOSIS — R110 Nausea: Secondary | ICD-10-CM | POA: Insufficient documentation

## 2013-01-24 DIAGNOSIS — R51 Headache: Secondary | ICD-10-CM | POA: Insufficient documentation

## 2013-01-24 DIAGNOSIS — G4700 Insomnia, unspecified: Secondary | ICD-10-CM | POA: Insufficient documentation

## 2013-01-24 DIAGNOSIS — G8929 Other chronic pain: Secondary | ICD-10-CM | POA: Insufficient documentation

## 2013-01-24 DIAGNOSIS — F411 Generalized anxiety disorder: Secondary | ICD-10-CM | POA: Insufficient documentation

## 2013-01-24 DIAGNOSIS — Z8659 Personal history of other mental and behavioral disorders: Secondary | ICD-10-CM | POA: Insufficient documentation

## 2013-01-24 DIAGNOSIS — Z87728 Personal history of other specified (corrected) congenital malformations of nervous system and sense organs: Secondary | ICD-10-CM | POA: Insufficient documentation

## 2013-01-24 MED ORDER — DIPHENHYDRAMINE HCL 50 MG/ML IJ SOLN
25.0000 mg | Freq: Once | INTRAMUSCULAR | Status: AC
  Administered 2013-01-24: 25 mg via INTRAMUSCULAR
  Filled 2013-01-24: qty 1

## 2013-01-24 MED ORDER — METOCLOPRAMIDE HCL 5 MG/ML IJ SOLN
10.0000 mg | Freq: Once | INTRAMUSCULAR | Status: AC
  Administered 2013-01-24: 10 mg via INTRAMUSCULAR
  Filled 2013-01-24: qty 2

## 2013-01-24 MED ORDER — KETOROLAC TROMETHAMINE 30 MG/ML IJ SOLN
30.0000 mg | Freq: Once | INTRAMUSCULAR | Status: AC
  Administered 2013-01-24: 30 mg via INTRAMUSCULAR
  Filled 2013-01-24: qty 1

## 2013-01-24 NOTE — ED Provider Notes (Signed)
History    CSN: [id number] Arrival date & time 01/24/13  1219  First MD Initiated Contact with Patient 01/24/13 1307     Chief Complaint  Patient presents with  . Headache  . Insomnia  . Nausea  . Anxiety   (Consider location/radiation/quality/duration/timing/severity/associated sxs/prior Treatment) HPI Comments: Patient presents with headache. He states she's had ongoing headaches for about the last 2-3 months. He has been seen multiple times in the ED for similar headaches. He describes an ongoing constant headache to his bifrontal area. He was seen here on July 19 and states his headache went away after the injections that he got but then came back again next day he has been gradually worsening since then. He has an appointment to see a neurologist. On his last visit he told the physician that he had an appointment in 5 days. He now tells me that he has an appointment in 7 days from today. He denies any new or different symptoms. He does have some photophobia and nausea associated with headache. He denies any recent head trauma. He had a CT scan of his head last month that was normal. He has no fevers or neck stiffness.  Patient is a 42 y.o. male presenting with headaches and anxiety.  Headache Associated symptoms: nausea and photophobia   Associated symptoms: no abdominal pain, no back pain, no congestion, no cough, no diarrhea, no dizziness, no fatigue, no fever, no neck pain, no numbness and no vomiting   Anxiety Associated symptoms include headaches. Pertinent negatives include no chest pain, no abdominal pain and no shortness of breath.   Past Medical History  Diagnosis Date  . Anxiety   . Spina bifida   . Depression    Past Surgical History  Procedure Laterality Date  . Back surgery     No family history on file. History  Substance Use Topics  . Smoking status: Current Every Day Smoker -- 2.00 packs/day for 25 years    Types: Cigarettes  . Smokeless tobacco: Never  Used  . Alcohol Use: No    Review of Systems  Constitutional: Negative for fever, chills, diaphoresis and fatigue.  HENT: Negative for congestion, rhinorrhea, sneezing and neck pain.   Eyes: Positive for photophobia.  Respiratory: Negative for cough, chest tightness and shortness of breath.   Cardiovascular: Negative for chest pain and leg swelling.  Gastrointestinal: Positive for nausea. Negative for vomiting, abdominal pain, diarrhea and blood in stool.  Genitourinary: Negative for frequency, hematuria, flank pain and difficulty urinating.  Musculoskeletal: Negative for back pain and arthralgias.  Skin: Negative for rash.  Neurological: Positive for headaches. Negative for dizziness, speech difficulty, weakness and numbness.    Allergies  Review of patient's allergies indicates no known allergies.  Home Medications   Current Outpatient Rx  Name  Route  Sig  Dispense  Refill  . acetaminophen (TYLENOL) 500 MG tablet   Oral   Take 500 mg by mouth every 6 (six) hours as needed for pain.          BP 132/63  Pulse 97  Temp(Src) 98.9 F (37.2 C) (Oral)  Resp 18  Ht 6\' 4"  (1.93 m)  Wt 240 lb (108.863 kg)  BMI 29.23 kg/m2  SpO2 99% Physical Exam  Constitutional: He is oriented to person, place, and time. He appears well-developed and well-nourished.  HENT:  Head: Normocephalic and atraumatic.  Unable to visualize fundi  Eyes: Pupils are equal, round, and reactive to light.  Neck: Normal range  of motion. Neck supple.  Cardiovascular: Normal rate, regular rhythm and normal heart sounds.   Pulmonary/Chest: Effort normal and breath sounds normal. No respiratory distress. He has no wheezes. He has no rales. He exhibits no tenderness.  Abdominal: Soft. Bowel sounds are normal. There is no tenderness. There is no rebound and no guarding.  Musculoskeletal: Normal range of motion. He exhibits no edema.  Lymphadenopathy:    He has no cervical adenopathy.  Neurological: He is alert  and oriented to person, place, and time. He has normal strength. No cranial nerve deficit or sensory deficit. GCS eye subscore is 4. GCS verbal subscore is 5. GCS motor subscore is 6.  Gait normal  Skin: Skin is warm and dry. No rash noted.  Psychiatric: He has a normal mood and affect.    ED Course  Procedures (including critical care time) Labs Reviewed - No data to display No results found. 1. Headache     MDM  Patient ongoing chronic headaches. He states he has appointment next week with the neurologist. I advised him to followup with his neurologist. He was given injections of Toradol Reglan and Benadryl in the ED. He has no unusual symptoms or suggestions of subarachnoid hemorrhage or meningitis. He had recent imaging last month I did not feel any further workup was indicated currently.  Rolan Bucco, MD 01/24/13 [phone]

## 2013-01-24 NOTE — ED Notes (Addendum)
Pt states that he is having migraines with light and sound sensitivity, anxiety, nausea, vomiting, and decreased appetite.  Pt denies shortness of breath, NAD, and AAOx4.

## 2013-01-24 NOTE — ED Notes (Signed)
Pt reports chronic anxiety, headaches, insomnia and nausea.  Has been seen numerous times for similar symptoms.

## 2013-01-25 ENCOUNTER — Telehealth (HOSPITAL_COMMUNITY): Payer: Self-pay | Admitting: Emergency Medicine

## 2013-01-25 ENCOUNTER — Ambulatory Visit: Payer: Medicaid Other | Attending: Family Medicine | Admitting: Family Medicine

## 2013-01-25 ENCOUNTER — Emergency Department (HOSPITAL_COMMUNITY)
Admission: EM | Admit: 2013-01-25 | Discharge: 2013-01-25 | Disposition: A | Discharge status: Home / Self Care | Payer: BC Managed Care – PPO | Attending: Emergency Medicine | Admitting: Emergency Medicine

## 2013-01-25 ENCOUNTER — Encounter: Payer: Self-pay | Admitting: Family Medicine

## 2013-01-25 ENCOUNTER — Encounter (HOSPITAL_COMMUNITY): Payer: Self-pay

## 2013-01-25 VITALS — BP 117/86 | HR 85 | Temp 99.1°F | Resp 16 | Ht 76.0 in | Wt 233.6 lb

## 2013-01-25 DIAGNOSIS — Z87798 Personal history of other (corrected) congenital malformations: Secondary | ICD-10-CM | POA: Insufficient documentation

## 2013-01-25 DIAGNOSIS — R29818 Other symptoms and signs involving the nervous system: Secondary | ICD-10-CM

## 2013-01-25 DIAGNOSIS — R51 Headache: Secondary | ICD-10-CM | POA: Insufficient documentation

## 2013-01-25 DIAGNOSIS — F17200 Nicotine dependence, unspecified, uncomplicated: Secondary | ICD-10-CM | POA: Insufficient documentation

## 2013-01-25 DIAGNOSIS — F3289 Other specified depressive episodes: Secondary | ICD-10-CM | POA: Insufficient documentation

## 2013-01-25 DIAGNOSIS — G932 Benign intracranial hypertension: Secondary | ICD-10-CM

## 2013-01-25 DIAGNOSIS — Z791 Long term (current) use of non-steroidal anti-inflammatories (NSAID): Secondary | ICD-10-CM | POA: Insufficient documentation

## 2013-01-25 DIAGNOSIS — H5702 Anisocoria: Secondary | ICD-10-CM

## 2013-01-25 DIAGNOSIS — F411 Generalized anxiety disorder: Secondary | ICD-10-CM | POA: Insufficient documentation

## 2013-01-25 DIAGNOSIS — F329 Major depressive disorder, single episode, unspecified: Secondary | ICD-10-CM | POA: Insufficient documentation

## 2013-01-25 MED ORDER — METOCLOPRAMIDE HCL 5 MG/ML IJ SOLN
10.0000 mg | Freq: Once | INTRAMUSCULAR | Status: AC
  Administered 2013-01-25: 10 mg via INTRAVENOUS
  Filled 2013-01-25: qty 2

## 2013-01-25 MED ORDER — KETOROLAC TROMETHAMINE 30 MG/ML IJ SOLN
30.0000 mg | Freq: Once | INTRAMUSCULAR | Status: AC
  Administered 2013-01-25: 30 mg via INTRAVENOUS
  Filled 2013-01-25: qty 1
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
## 2013-01-25 NOTE — Progress Notes (Signed)
PT BEING SENT TO ER FOR ABNORMAL BASELINE. PUPIL CHANGES BILAT. PER DR. Laural Benes. SPOKE WITH ER NURSE

## 2013-01-25 NOTE — ED Notes (Signed)
Pt presents with 1 month h/o frontal and temporal headaches.  Pt seen at [facility] x 1 week ago for same, was referred to neuro but went to the clinic.  Pt seen at Urgent care to day and referred here for unequal pupils. Pt reports nausea and vomiting, intermittent blurred vision and generalized tremors.  +photophobia.

## 2013-01-25 NOTE — Progress Notes (Signed)
Patient ID: Paul Baldwin, male   DOB: 1971/04/18, 42 y.o.   MRN: [id number]  CC:  Headaches   HPI: Pt says that he was sent from the ER to this clinic to get referred to a neurologist.  Pt says that this month he started having headaches involving the entire head associated with nausea and diarrhea.  He has been to the ER several times and had a negative CT scan.  He says that he has been taking lots of tylenol recently because of pain.  Pt had received injections in the ER for relief but says that the headaches have returned.  Pt says that he has been having a lot of pain and reports that he is not seeking pain meds but wants some assistance.  Pt says that he had spinal surgery years ago and was told it was for spina bifida.  He says that he was told that he could have problems later in life.    No Known Allergies Past Medical History  Diagnosis Date  . Anxiety   . Spina bifida   . Depression    Current Outpatient Prescriptions on File Prior to Visit  Medication Sig Dispense Refill  . acetaminophen (TYLENOL) 500 MG tablet Take 500 mg by mouth every 6 (six) hours as needed for pain.       No current facility-administered medications on file prior to visit.   No family history on file. History   Social History  . Marital Status: Single    Spouse Name: N/A    Number of Children: N/A  . Years of Education: N/A   Occupational History  . Not on file.   Social History Main Topics  . Smoking status: Current Every Day Smoker -- 2.00 packs/day for 25 years    Types: Cigarettes  . Smokeless tobacco: Never Used  . Alcohol Use: No  . Drug Use: No  . Sexually Active: Not Currently   Other Topics Concern  . Not on file   Social History Narrative   ** Merged History Encounter **        Review of Systems  Constitutional: Negative for fever, chills, diaphoresis, activity change, appetite change and fatigue.  HENT: Negative for ear pain, nosebleeds, congestion, facial swelling,  rhinorrhea, neck pain, neck stiffness and ear discharge.   Eyes: Negative for pain, discharge, redness, itching and visual disturbance.  Respiratory: Negative for cough, choking, chest tightness, shortness of breath, wheezing and stridor.   Cardiovascular: Negative for chest pain, palpitations and leg swelling.  Gastrointestinal: Negative for abdominal distention.  Genitourinary: Negative for dysuria, urgency, frequency, hematuria, flank pain, decreased urine volume, difficulty urinating and dyspareunia.  Musculoskeletal: Negative for back pain, joint swelling, arthralgias and gait problem.  Neurological: Negative for dizziness, tremors, seizures, syncope, facial asymmetry, speech difficulty, weakness, light-headedness, numbness and headaches.  Hematological: Negative for adenopathy. Does not bruise/bleed easily.  Psychiatric/Behavioral: Negative for hallucinations, behavioral problems, confusion, dysphoric mood, decreased concentration and agitation.    Objective:   Filed Vitals:   01/25/13 1015  BP: 117/86  Pulse: 85  Temp: 99.1 F (37.3 C)  Resp: 16    Physical Exam  Constitutional: Appears well-developed and well-nourished. No distress.  HENT: Normocephalic. External right and left ear normal. Oropharynx is clear and moist.  Eyes: Conjunctivae and EOM are normal. Left pupil dilated, unequal, poorly responsive to light, no scleral icterus. Concern for papilledema (unable to perform dilated eye exam).  Neck: Normal ROM. Neck supple. No JVD. No tracheal deviation.  No thyromegaly.  CVS: RRR, S1/S2 +, no murmurs, no gallops, no carotid bruit.  Pulmonary: Effort and breath sounds normal, no stridor, rhonchi, wheezes, rales.  Abdominal: Soft. BS +,  no distension, tenderness, rebound or guarding.  Musculoskeletal: Normal range of motion. No edema and no tenderness.  Lymphadenopathy: No lymphadenopathy noted, cervical, inguinal. Neuro: Alert. Normal reflexes, muscle tone coordination. No  cranial nerve deficit. Skin: Skin is warm and dry. No rash noted. Not diaphoretic. No erythema. No pallor.  Psychiatric: Normal mood and affect. Behavior, judgment, thought content normal.   Lab Results  Component Value Date   WBC 6.9 12/17/2010   HGB 14.4 12/17/2010   HCT 42.3 12/17/2010   MCV 85.1 12/17/2010   PLT 187 12/17/2010   Lab Results  Component Value Date   CREATININE 1.00 01/01/2012   BUN 14 01/01/2012   NA 137 01/01/2012   K 4.2 01/01/2012   CL 100 01/01/2012   CO2 29 01/01/2012    No results found for this basename: HGBA1C   Lipid Panel  No results found for this basename: chol, trig, hdl, cholhdl, vldl, ldlcalc       Assessment and plan:   Patient Active Problem List   Diagnosis Date Noted  . Frequent headaches 01/25/2013   Pt has unequal pupils.  Will get him in to see a neurologist ASAP.  I could not get him an appointment to be seen in the next several weeks,  I'm concerned about increased intracranial pressure. Will send to ER for further evaluation.   Pt did have a CT done at ER recently and I reviewed it with him today.  Called and reported concerns to ER so that they could have him evaluated by a neurologist on call.    Follow up after being evaluated  The patient was given clear instructions to go to ER or return to medical center if symptoms don't improve, worsen or new problems develop.  The patient verbalized understanding.  The patient was told to call to get any lab results if not heard anything in the next week.    Rodney Langton, MD, CDE, FAAFP [facility] [place], [place]

## 2013-01-25 NOTE — ED Notes (Signed)
Pt reports frontal headache X1 month.  Was sent here by md due to the fact that his pupils are not equal, blurred vision, tremors, photophobia, anxiety, lack of sleep and states he has lost 28lbs in 1 month.  Denies any trauma.  States pain is sharp and constant and wakes him from sleep.  RN notes pupils are reactive and equal on assessment.  Nuero assessment wnl.  Pt alert oriented X4

## 2013-01-25 NOTE — Patient Instructions (Addendum)
Please go to ER now for further evaluation of unequal pupils, headache and increased intracranial pressure  Headaches, Frequently Asked Questions MIGRAINE HEADACHES Q: What is migraine? What causes it? How can I treat it? A: Generally, migraine headaches begin as a dull ache. Then they develop into a constant, throbbing, and pulsating pain. You may experience pain at the temples. You may experience pain at the front or back of one or both sides of the head. The pain is usually accompanied by a combination of:  Nausea.  Vomiting.  Sensitivity to light and noise. Some people (about 15%) experience an aura (see below) before an attack. The cause of migraine is believed to be chemical reactions in the brain. Treatment for migraine may include over-the-counter or prescription medications. It may also include self-help techniques. These include relaxation training and biofeedback.  Q: What is an aura? A: About 15% of people with migraine get an "aura". This is a sign of neurological symptoms that occur before a migraine headache. You may see wavy or jagged lines, dots, or flashing lights. You might experience tunnel vision or blind spots in one or both eyes. The aura can include visual or auditory hallucinations (something imagined). It may include disruptions in smell (such as strange odors), taste or touch. Other symptoms include:  Numbness.  A "pins and needles" sensation.  Difficulty in recalling or speaking the correct word. These neurological events may last as long as 60 minutes. These symptoms will fade as the headache begins. Q: What is a trigger? A: Certain physical or environmental factors can lead to or "trigger" a migraine. These include:  Foods.  Hormonal changes.  Weather.  Stress. It is important to remember that triggers are different for everyone. To help prevent migraine attacks, you need to figure out which triggers affect you. Keep a headache diary. This is a good way  to track triggers. The diary will help you talk to your healthcare professional about your condition. Q: Does weather affect migraines? A: Bright sunshine, hot, humid conditions, and drastic changes in barometric pressure may lead to, or "trigger," a migraine attack in some people. But studies have shown that weather does not act as a trigger for everyone with migraines. Q: What is the link between migraine and hormones? A: Hormones start and regulate many of your body's functions. Hormones keep your body in balance within a constantly changing environment. The levels of hormones in your body are unbalanced at times. Examples are during menstruation, pregnancy, or menopause. That can lead to a migraine attack. In fact, about three quarters of all women with migraine report that their attacks are related to the menstrual cycle.  Q: Is there an increased risk of stroke for migraine sufferers? A: The likelihood of a migraine attack causing a stroke is very remote. That is not to say that migraine sufferers cannot have a stroke associated with their migraines. In persons under age 19, the most common associated factor for stroke is migraine headache. But over the course of a person's normal life span, the occurrence of migraine headache may actually be associated with a reduced risk of dying from cerebrovascular disease due to stroke.  Q: What are acute medications for migraine? A: Acute medications are used to treat the pain of the headache after it has started. Examples over-the-counter medications, NSAIDs, ergots, and triptans.  Q: What are the triptans? A: Triptans are the newest class of abortive medications. They are specifically targeted to treat migraine. Triptans are vasoconstrictors. They  moderate some chemical reactions in the brain. The triptans work on receptors in your brain. Triptans help to restore the balance of a neurotransmitter called serotonin. Fluctuations in levels of serotonin are  thought to be a main cause of migraine.  Q: Are over-the-counter medications for migraine effective? A: Over-the-counter, or "OTC," medications may be effective in relieving mild to moderate pain and associated symptoms of migraine. But you should see your caregiver before beginning any treatment regimen for migraine.  Q: What are preventive medications for migraine? A: Preventive medications for migraine are sometimes referred to as "prophylactic" treatments. They are used to reduce the frequency, severity, and length of migraine attacks. Examples of preventive medications include antiepileptic medications, antidepressants, beta-blockers, calcium channel blockers, and NSAIDs (nonsteroidal anti-inflammatory drugs). Q: Why are anticonvulsants used to treat migraine? A: During the past few years, there has been an increased interest in antiepileptic drugs for the prevention of migraine. They are sometimes referred to as "anticonvulsants". Both epilepsy and migraine may be caused by similar reactions in the brain.  Q: Why are antidepressants used to treat migraine? A: Antidepressants are typically used to treat people with depression. They may reduce migraine frequency by regulating chemical levels, such as serotonin, in the brain.  Q: What alternative therapies are used to treat migraine? A: The term "alternative therapies" is often used to describe treatments considered outside the scope of conventional Western medicine. Examples of alternative therapy include acupuncture, acupressure, and yoga. Another common alternative treatment is herbal therapy. Some herbs are believed to relieve headache pain. Always discuss alternative therapies with your caregiver before proceeding. Some herbal products contain arsenic and other toxins. TENSION HEADACHES Q: What is a tension-type headache? What causes it? How can I treat it? A: Tension-type headaches occur randomly. They are often the result of temporary stress,  anxiety, fatigue, or anger. Symptoms include soreness in your temples, a tightening band-like sensation around your head (a "vice-like" ache). Symptoms can also include a pulling feeling, pressure sensations, and contracting head and neck muscles. The headache begins in your forehead, temples, or the back of your head and neck. Treatment for tension-type headache may include over-the-counter or prescription medications. Treatment may also include self-help techniques such as relaxation training and biofeedback. CLUSTER HEADACHES Q: What is a cluster headache? What causes it? How can I treat it? A: Cluster headache gets its name because the attacks come in groups. The pain arrives with little, if any, warning. It is usually on one side of the head. A tearing or bloodshot eye and a runny nose on the same side of the headache may also accompany the pain. Cluster headaches are believed to be caused by chemical reactions in the brain. They have been described as the most severe and intense of any headache type. Treatment for cluster headache includes prescription medication and oxygen. SINUS HEADACHES Q: What is a sinus headache? What causes it? How can I treat it? A: When a cavity in the bones of the face and skull (a sinus) becomes inflamed, the inflammation will cause localized pain. This condition is usually the result of an allergic reaction, a tumor, or an infection. If your headache is caused by a sinus blockage, such as an infection, you will probably have a fever. An x-ray will confirm a sinus blockage. Your caregiver's treatment might include antibiotics for the infection, as well as antihistamines or decongestants.  REBOUND HEADACHES Q: What is a rebound headache? What causes it? How can I treat it? A: A  pattern of taking acute headache medications too often can lead to a condition known as "rebound headache." A pattern of taking too much headache medication includes taking it more than 2 days per  week or in excessive amounts. That means more than the label or a caregiver advises. With rebound headaches, your medications not only stop relieving pain, they actually begin to cause headaches. Doctors treat rebound headache by tapering the medication that is being overused. Sometimes your caregiver will gradually substitute a different type of treatment or medication. Stopping may be a challenge. Regularly overusing a medication increases the potential for serious side effects. Consult a caregiver if you regularly use headache medications more than 2 days per week or more than the label advises. ADDITIONAL QUESTIONS AND ANSWERS Q: What is biofeedback? A: Biofeedback is a self-help treatment. Biofeedback uses special equipment to monitor your body's involuntary physical responses. Biofeedback monitors:  Breathing.  Pulse.  Heart rate.  Temperature.  Muscle tension.  Brain activity. Biofeedback helps you refine and perfect your relaxation exercises. You learn to control the physical responses that are related to stress. Once the technique has been mastered, you do not need the equipment any more. Q: Are headaches hereditary? A: Four out of five (80%) of people that suffer report a family history of migraine. Scientists are not sure if this is genetic or a family predisposition. Despite the uncertainty, a child has a 50% chance of having migraine if one parent suffers. The child has a 75% chance if both parents suffer.  Q: Can children get headaches? A: By the time they reach high school, most young people have experienced some type of headache. Many safe and effective approaches or medications can prevent a headache from occurring or stop it after it has begun.  Q: What type of doctor should I see to diagnose and treat my headache? A: Start with your primary caregiver. Discuss his or her experience and approach to headaches. Discuss methods of classification, diagnosis, and treatment. Your  caregiver may decide to recommend you to a headache specialist, depending upon your symptoms or other physical conditions. Having diabetes, allergies, etc., may require a more comprehensive and inclusive approach to your headache. The National Headache Foundation will provide, upon request, a list of [facility] physician members in your state. Document Released: 09/11/2003 Document Revised: 09/13/2011 Document Reviewed: 02/19/2008 [facility] Patient Information 2014 [place], [place]. Headaches, Analgesic Rebound Analgesic agents are prescription or over-the-counter medications used to control pain, including headaches. However, overuse or misuse of theses medications can lead to rebound headaches. Rebound headaches are headaches that recur after the analgesic medication wears off. Eventually, the rebound headaches can become longlasting (chronic). If this happens, you must completely stop using analgesic medications. If not, the chronic headache is likely to continue despite the use of any other treatment. Usually when you stop taking analgesic medications, the headache may initally get worse for several days. Along with this you may experience sickness in your stomach (nausea), and you may throw up (vomit). After a period of 3 to 5 days, these symptoms begin to improve. Sometimes improvement may take longer. Eventually, the headaches will slowly improve with treatment with the right medications. Most people are able to stop using analgesic medications at home with a caregiver's supervision. But some find it difficult and may require hospitalization. Document Released: 09/11/2003 Document Revised: 09/13/2011 Document Reviewed: 02/08/2008 [facility] Patient Information 2014 [place], [place].

## 2013-01-25 NOTE — ED Provider Notes (Signed)
History    CSN: [id number] Arrival date & time 01/25/13  1109  First MD Initiated Contact with Patient 01/25/13 1130      chief complaint: Headache  The history is provided by the patient and medical records.   Patient ports a history of recurrent headaches.  He's been seen in the emergency apartment several times in the last several weeks for similar complaints.  He has followup with the neurologist.  His pain is mild to moderate in severity.  His pain is located in his frontal and temporal regions.  The patient was seen in urgent care earlier today and they're concerned about possible anisocoria and sent him to the emergency department.  His nausea and vomiting.  He reports photophobia.  He reports no improvement with over-the-counter pain medications.  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  He also has a history of depression and anxiety as well as recurrent headaches.  Nothing improves his pain.   Past Medical History  Diagnosis Date  . Anxiety   . Spina bifida   . Depression    Past Surgical History  Procedure Laterality Date  . Back surgery     Family History  Problem Relation Age of Onset  . Mental illness Mother     PANIC ATTACKS  . Heart disease Father    History  Substance Use Topics  . Smoking status: Current Every Day Smoker -- 2.00 packs/day for 25 years    Types: Cigarettes  . Smokeless tobacco: Never Used  . Alcohol Use: No    Review of Systems  All other systems reviewed and are negative.    Allergies  Review of patient's allergies indicates no known allergies.  Home Medications   Current Outpatient Rx  Name  Route  Sig  Dispense  Refill  . acetaminophen (TYLENOL) 500 MG tablet   Oral   Take 500 mg by mouth every 6 (six) hours as needed for pain.         Marland Kitchen ALPRAZolam (XANAX) 0.5 MG tablet   Oral   Take 0.5 mg by mouth at bedtime as needed for anxiety.         Marland Kitchen ibuprofen (ADVIL,MOTRIN) 200 MG  tablet   Oral   Take 200 mg by mouth every 6 (six) hours as needed for headache.         . naproxen sodium (ANAPROX) 220 MG tablet   Oral   Take 440 mg by mouth 2 (two) times daily with a meal.          BP 132/82  Pulse 85  Temp(Src) 98.6 F (37 C) (Oral)  Resp 18  SpO2 99% Physical Exam  Nursing note and vitals reviewed. Constitutional: He is oriented to person, place, and time. He appears well-developed and well-nourished.  HENT:  Head: Normocephalic and atraumatic.  Eyes: EOM are normal. Pupils are equal, round, and reactive to light.  Neck: Normal range of motion.  Cardiovascular: Normal rate, regular rhythm, normal heart sounds and intact distal pulses.   Pulmonary/Chest: Effort normal and breath sounds normal. No respiratory distress.  Abdominal: Soft. He exhibits no distension. There is no tenderness.  Genitourinary: Rectum normal.  Musculoskeletal: Normal range of motion.  Neurological: He is alert and oriented to person, place, and time.  5/5 strength in major muscle groups of  bilateral upper and lower extremities. Speech normal. No facial asymetry.   Skin: Skin is warm and dry.  Psychiatric:  He has a normal mood and affect. Judgment normal.    ED Course  Procedures (including critical care time) Labs Reviewed - No data to display No results found. 1. Headache     MDM  12:43 PM Feels much better at this time. Neuro follow up. No indication for imaging today  Lyanne Co, MD 01/27/13 [phone]

## 2013-01-25 NOTE — ED Notes (Signed)
Pt mother insist on seeing a neurologist while at ED today.  States pt has been through this before and headaches have something to do with his spine not just migraines.  Mother states that she was told at [facility] that pt would be able to see an neurologist and find out what is causing headaches.  Pt has appt with neurologist in 6 weeks.  Mother does not want him to have to wait that long.

## 2013-03-13 ENCOUNTER — Emergency Department (HOSPITAL_BASED_OUTPATIENT_CLINIC_OR_DEPARTMENT_OTHER)
Admission: EM | Admit: 2013-03-13 | Discharge: 2013-03-14 | Disposition: A | Discharge status: Home / Self Care | Payer: BC Managed Care – PPO | Attending: Emergency Medicine | Admitting: Emergency Medicine

## 2013-03-13 ENCOUNTER — Encounter (HOSPITAL_BASED_OUTPATIENT_CLINIC_OR_DEPARTMENT_OTHER): Payer: Self-pay

## 2013-03-13 DIAGNOSIS — F3289 Other specified depressive episodes: Secondary | ICD-10-CM | POA: Insufficient documentation

## 2013-03-13 DIAGNOSIS — R42 Dizziness and giddiness: Secondary | ICD-10-CM | POA: Insufficient documentation

## 2013-03-13 DIAGNOSIS — R110 Nausea: Secondary | ICD-10-CM | POA: Insufficient documentation

## 2013-03-13 DIAGNOSIS — Z79899 Other long term (current) drug therapy: Secondary | ICD-10-CM | POA: Insufficient documentation

## 2013-03-13 DIAGNOSIS — F329 Major depressive disorder, single episode, unspecified: Secondary | ICD-10-CM | POA: Insufficient documentation

## 2013-03-13 DIAGNOSIS — F411 Generalized anxiety disorder: Secondary | ICD-10-CM | POA: Insufficient documentation

## 2013-03-13 DIAGNOSIS — F17200 Nicotine dependence, unspecified, uncomplicated: Secondary | ICD-10-CM | POA: Insufficient documentation

## 2013-03-13 DIAGNOSIS — Z87728 Personal history of other specified (corrected) congenital malformations of nervous system and sense organs: Secondary | ICD-10-CM | POA: Insufficient documentation

## 2013-03-13 DIAGNOSIS — R51 Headache: Secondary | ICD-10-CM | POA: Insufficient documentation

## 2013-03-13 LAB — ACETAMINOPHEN LEVEL: Acetaminophen (Tylenol), Serum: <15 ug/mL (ref 10–30)

## 2013-03-13 LAB — COMPREHENSIVE METABOLIC PANEL
ALT: 42 U/L (ref 0–53)
Albumin: 3.6 g/dL (ref 3.5–5.2)
Alkaline Phosphatase: 72 U/L (ref 39–117)
Calcium: 9.2 mg/dL (ref 8.4–10.5)
Potassium: 3.2 mEq/L — ABNORMAL LOW (ref 3.5–5.1)
Sodium: 140 mEq/L (ref 135–145)
Total Protein: 6.3 g/dL (ref 6.0–8.3)

## 2013-03-13 MED ORDER — METOCLOPRAMIDE HCL 5 MG/ML IJ SOLN
10.0000 mg | Freq: Once | INTRAMUSCULAR | Status: AC
  Administered 2013-03-13: 10 mg via INTRAVENOUS
  Filled 2013-03-13: qty 2

## 2013-03-13 MED ORDER — SODIUM CHLORIDE 0.9 % IV SOLN
INTRAVENOUS | Status: DC
  Administered 2013-03-13: 23:00:00 via INTRAVENOUS

## 2013-03-13 MED ORDER — DIPHENHYDRAMINE HCL 50 MG/ML IJ SOLN
25.0000 mg | Freq: Once | INTRAMUSCULAR | Status: AC
  Administered 2013-03-13: 25 mg via INTRAVENOUS
  Filled 2013-03-13: qty 1

## 2013-03-13 MED ORDER — KETOROLAC TROMETHAMINE 30 MG/ML IJ SOLN
30.0000 mg | Freq: Once | INTRAMUSCULAR | Status: AC
  Administered 2013-03-13: 30 mg via INTRAVENOUS
  Filled 2013-03-13: qty 1

## 2013-03-13 MED ORDER — CYCLOPENTOLATE HCL 1 % OP SOLN
1.0000 [drp] | Freq: Once | OPHTHALMIC | Status: AC
  Administered 2013-03-13: 1 [drp] via OPHTHALMIC
  Filled 2013-03-13: qty 2

## 2013-03-13 NOTE — ED Notes (Signed)
Dizziness, HA since this am.  This is chronic intermittant problem due to Spina Bifida.  Patient states he has taken approximately 30-35 Ibuprofen today- 5-6 at a time every 2-3 hours for HA

## 2013-03-13 NOTE — ED Provider Notes (Signed)
CSN: [id number]     Arrival date & time 03/13/13  2236 History  This chart was scribed for Hanley Seamen, MD by Bennett Scrape, ED Scribe. This patient was seen in room MH01/MH01 and the patient's care was started at 10:58 PM.   Chief Complaint  Patient presents with  . Headache   The history is provided by the patient. No language interpreter was used.    HPI Comments: Paul Baldwin is a 42 y.o. male with a h/o spina bifida and tethered cord.who presents to the Emergency Department complaining of chronic intermittent HAs over the past several months. He had an MRI at [facility] 3 to 4 weeks ago and was told that he had an accumulation of spinal fluid that was causing his HAs by putting pressure on his optic nerves. His last visit to the ED for the same was on 01/25/13 but states that he left due to being accused of being a narcotic seeker. He states that he has been following up with [facility] for his symptoms and has been HA free for the past 2 weeks. His states that his pain returned today with associated nausea and dizziness. He admits to taking 30 to 35 ibuprofen today, 5 to 6 at a time every 2 to 3 hours, with no improvement. He denies emesis, diarrhea, fevers, visual disturbances, syncope and SOB as associated symptoms. He reports frequent alcohol use and marijuana use.   Past Medical History  Diagnosis Date  . Anxiety   . Spina bifida   . Depression    Past Surgical History  Procedure Laterality Date  . Back surgery     Family History  Problem Relation Age of Onset  . Mental illness Mother     PANIC ATTACKS  . Heart disease Father    History  Substance Use Topics  . Smoking status: Current Every Day Smoker -- 2.00 packs/day for 25 years    Types: Cigarettes  . Smokeless tobacco: Never Used  . Alcohol Use: No    Review of Systems  A complete 10 system review of systems was obtained and all systems are negative except as noted in the HPI and PMH.   Allergies   Review of patient's allergies indicates no known allergies.  Home Medications   Current Outpatient Rx  Name  Route  Sig  Dispense  Refill  . acetaminophen (TYLENOL) 500 MG tablet   Oral   Take 500 mg by mouth every 6 (six) hours as needed for pain.         Marland Kitchen ALPRAZolam (XANAX) 0.5 MG tablet   Oral   Take 0.5 mg by mouth at bedtime as needed for anxiety.         Marland Kitchen ibuprofen (ADVIL,MOTRIN) 200 MG tablet   Oral   Take 200 mg by mouth every 6 (six) hours as needed for headache.         . naproxen sodium (ANAPROX) 220 MG tablet   Oral   Take 440 mg by mouth 2 (two) times daily with a meal.           Physical Exam  Nursing note and vitals reviewed.  Triage Vitals: BP 130/76  Pulse 99  Temp(Src) 99 F (37.2 C) (Oral)  Resp 16  Ht 6\' 4"  (1.93 m)  Wt 230 lb (104.327 kg)  BMI 28.01 kg/m2  SpO2 99%  General: Well-developed, well-nourished male in no acute distress; appearance consistent with age of record HENT: normocephalic; atraumatic Eyes: pupils  equal, round and reactive to light; extraocular muscles intact; disc margins sharp; no papilledema; no hemorrhages seen Neck: supple Heart: regular rate and rhythm; no murmurs, rubs or gallops Lungs: clear to auscultation bilaterally Abdomen: soft; nondistended; nontender; no masses or hepatosplenomegaly; bowel sounds present Extremities: No deformity; full range of motion; pulses normal Neurologic: Awake, alert and oriented; motor function intact in all extremities and symmetric; no facial droop, normal coordination of speech, no focal deficits Skin: Warm and dry Psychiatric: Normal mood and flat affect  ED Course  Procedures (including critical care time)  DIAGNOSTIC STUDIES: Oxygen Saturation is 99% on room air, normal by my interpretation.    COORDINATION OF CARE: 11:07 PM-Upon review of records, there is no visit note with a neurologist per EPIC and no record of recent MRI. Discussed treatment plan which  includes cyclo gel in eyes and migraine cocktail with pt at bedside and pt agreed to plan.    MDM   Nursing notes and vitals signs, including pulse oximetry, reviewed.  Summary of this visit's results, reviewed by myself:  Labs:  Results for orders placed during the hospital encounter of 03/13/13 (from the past 24 hour(s))  COMPREHENSIVE METABOLIC PANEL     Status: Abnormal   Collection Time    03/13/13 11:20 PM      Result Value Range   Sodium 140  135 - 145 mEq/L   Potassium 3.2 (*) 3.5 - 5.1 mEq/L   Chloride 105  96 - 112 mEq/L   CO2 26  19 - 32 mEq/L   Glucose, Bld 121 (*) 70 - 99 mg/dL   BUN 16  6 - 23 mg/dL   Creatinine, Ser 4.09  0.50 - 1.35 mg/dL   Calcium 9.2  8.4 - 81.1 mg/dL   Total Protein 6.3  6.0 - 8.3 g/dL   Albumin 3.6  3.5 - 5.2 g/dL   AST 914 (*) 0 - 37 U/L   ALT 42  0 - 53 U/L   Alkaline Phosphatase 72  39 - 117 U/L   Total Bilirubin 0.2 (*) 0.3 - 1.2 mg/dL   GFR calc non Af Amer >90  >90 mL/min   GFR calc Af Amer >90  >90 mL/min  ACETAMINOPHEN LEVEL     Status: None   Collection Time    03/13/13 11:20 PM      Result Value Range   Acetaminophen (Tylenol), Serum <15.0  10 - 30 ug/mL   12:04 AM Headache significantly improved after IV medications. No evidence of acute neurologic incident based on history and exam. Patient states he recently had an MRI. He is to follow up with his neurologist. There is no evidence of acetaminophen toxicity but he was advised to limit his intake of over-the-counter analgesics to the doses specified on the packages.    I personally performed the services described in this documentation, which was scribed in my presence.  The recorded information has been reviewed and considered.    Hanley Seamen, MD 03/14/13 [phone]

## 2013-03-13 NOTE — ED Notes (Signed)
MD at bedside. 

## 2013-03-14 ENCOUNTER — Emergency Department (HOSPITAL_BASED_OUTPATIENT_CLINIC_OR_DEPARTMENT_OTHER)
Admission: EM | Admit: 2013-03-14 | Discharge: 2013-03-15 | Disposition: A | Discharge status: Home / Self Care | Payer: BC Managed Care – PPO | Attending: Emergency Medicine | Admitting: Emergency Medicine

## 2013-03-14 DIAGNOSIS — F3289 Other specified depressive episodes: Secondary | ICD-10-CM | POA: Insufficient documentation

## 2013-03-14 DIAGNOSIS — F17200 Nicotine dependence, unspecified, uncomplicated: Secondary | ICD-10-CM | POA: Insufficient documentation

## 2013-03-14 DIAGNOSIS — F329 Major depressive disorder, single episode, unspecified: Secondary | ICD-10-CM | POA: Insufficient documentation

## 2013-03-14 DIAGNOSIS — Z87768 Personal history of other specified (corrected) congenital malformations of integument, limbs and musculoskeletal system: Secondary | ICD-10-CM | POA: Insufficient documentation

## 2013-03-14 DIAGNOSIS — Z791 Long term (current) use of non-steroidal anti-inflammatories (NSAID): Secondary | ICD-10-CM | POA: Insufficient documentation

## 2013-03-14 DIAGNOSIS — Z8776 Personal history of (corrected) congenital malformations of integument, limbs and musculoskeletal system: Secondary | ICD-10-CM | POA: Insufficient documentation

## 2013-03-14 DIAGNOSIS — G43909 Migraine, unspecified, not intractable, without status migrainosus: Secondary | ICD-10-CM

## 2013-03-14 DIAGNOSIS — F411 Generalized anxiety disorder: Secondary | ICD-10-CM | POA: Insufficient documentation

## 2013-03-14 MED ORDER — METOCLOPRAMIDE HCL 5 MG/ML IJ SOLN: 10.0000 mg | Freq: Once | INTRAMUSCULAR | Status: AC

## 2013-03-14 MED ORDER — METOCLOPRAMIDE HCL 5 MG/ML IJ SOLN
INTRAMUSCULAR | Status: AC
  Administered 2013-03-14: 10 mg via INTRAVENOUS
  Filled 2013-03-14: qty 2

## 2013-03-14 MED ORDER — KETOROLAC TROMETHAMINE 60 MG/2ML IM SOLN
INTRAMUSCULAR | Status: AC
  Administered 2013-03-14: 60 mg via INTRAMUSCULAR
  Filled 2013-03-14: qty 2

## 2013-03-14 MED ORDER — KETOROLAC TROMETHAMINE 60 MG/2ML IM SOLN: 60.0000 mg | Freq: Once | INTRAMUSCULAR | Status: AC

## 2013-03-14 MED ORDER — SUMATRIPTAN SUCCINATE 25 MG PO TABS: 100.0000 mg | ORAL_TABLET | Freq: Four times a day (QID) | ORAL | Status: DC | PRN

## 2013-03-14 MED ORDER — SUMATRIPTAN SUCCINATE 25 MG PO TABS: 25.0000 mg | ORAL_TABLET | Freq: Three times a day (TID) | ORAL | Status: DC | PRN

## 2013-03-14 NOTE — ED Notes (Signed)
Pt c/o headache. Pt seen here last night for same.

## 2013-03-14 NOTE — ED Notes (Signed)
MD at bedside. 

## 2013-03-14 NOTE — ED Notes (Signed)
Pt states he needs to go home because he needs to get up early. RN made aware and to room to talk to pt.

## 2013-03-14 NOTE — ED Provider Notes (Signed)
CSN: [id number]     Arrival date & time 03/14/13  2156 History  This chart was scribed for Paul Baldwin Smitty Cords, MD by Blanchard Kelch, ED Scribe. The patient was seen in room MH03/MH03. Patient's care was started at 11:05 PM.    Chief Complaint  Patient presents with  . Headache    Patient is a 42 y.o. male presenting with migraines. The history is provided by the patient. No language interpreter was used.  Migraine This is a chronic problem. The current episode started more than 1 week ago. The problem occurs constantly. The problem has not changed since onset.Pertinent negatives include no chest pain, no abdominal pain and no shortness of breath. Nothing aggravates the symptoms. Nothing relieves the symptoms. He has tried nothing for the symptoms. The treatment provided no relief.    HPI Comments: Numan Zylstra Welge is a 42 y.o. male with history of spina bifida who presents to the Emergency Department complaining of a severe, constant, chronic migraine behind his ears that began last night. He was here last night and was denied pain medications. He is currently asking for narcotics. He reports a history of migraines. He states his neurologist says he has spinal fluid pressing on his skull causing headaches.  No f/c/r.  No weakness nor numbness no changes in vision or cognition.  Would like an rx for narcotics to get him through until he can see his doctor in 2 weeks  Past Medical History  Diagnosis Date  . Anxiety   . Spina bifida   . Depression    Past Surgical History  Procedure Laterality Date  . Back surgery     Family History  Problem Relation Age of Onset  . Mental illness Mother     PANIC ATTACKS  . Heart disease Father    History  Substance Use Topics  . Smoking status: Current Every Day Smoker -- 2.00 packs/day for 25 years    Types: Cigarettes  . Smokeless tobacco: Never Used  . Alcohol Use: No    Review of Systems  Respiratory: Negative for shortness of  breath.   Cardiovascular: Negative for chest pain.  Gastrointestinal: Negative for abdominal pain.  All other systems reviewed and are negative.     Allergies  Review of patient's allergies indicates no known allergies.  Home Medications   Current Outpatient Rx  Name  Route  Sig  Dispense  Refill  . acetaminophen (TYLENOL) 500 MG tablet   Oral   Take 500 mg by mouth every 6 (six) hours as needed for pain.         Marland Kitchen ALPRAZolam (XANAX) 0.5 MG tablet   Oral   Take 0.5 mg by mouth at bedtime as needed for anxiety.         Marland Kitchen ibuprofen (ADVIL,MOTRIN) 200 MG tablet   Oral   Take 200 mg by mouth every 6 (six) hours as needed for headache.         . naproxen sodium (ANAPROX) 220 MG tablet   Oral   Take 440 mg by mouth 2 (two) times daily with a meal.          Triage Vitals: BP 141/91  Pulse 83  Temp(Src) 98.8 F (37.1 C) (Oral)  Resp 18  SpO2 100%  Physical Exam  Nursing note and vitals reviewed. Constitutional: He is oriented to person, place, and time. He appears well-developed and well-nourished.  HENT:  Head: Normocephalic and atraumatic.  Mouth/Throat: Oropharynx is clear and moist.  Right TM intact. Trachea midline.  Eyes: EOM are normal. Pupils are equal, round, and reactive to light.  Neck: Normal range of motion.  No lymphadenopathy.  Cardiovascular: Normal rate and regular rhythm.   Pulmonary/Chest: Effort normal and breath sounds normal.  Abdominal: Soft. Bowel sounds are normal. There is no rebound and no guarding.  Musculoskeletal: Normal range of motion.  Neurological: He is alert and oriented to person, place, and time.  Skin: Skin is warm and dry.  Psychiatric: He has a normal mood and affect.    ED Course  Procedures (including critical care time)  DIAGNOSTIC STUDIES: Oxygen Saturation is 100% on room air, normal by my interpretation.    COORDINATION OF CARE:  11:10 PM - Will give migraine cocktail. Will not order narcotics for pain.  Patient verbalizes understanding and agrees with treatment plan.    Labs Review Labs Reviewed - No data to display Imaging Review No results found.  MDM  Case d/w Neurology, Dr. Thad Ranger, [facility] would not have told a patient to return to the ED for HA.   Will not prescribe narcotics for HA.  Follow up with neurology   I personally performed the services described in this documentation, which was scribed in my presence. The recorded information has been reviewed and is accurate.     Jasmine Awe, MD 03/15/13 [phone]

## 2013-03-14 NOTE — ED Notes (Signed)
Pt was requesting rx for pain medication last night and was not given one. Pt again stating he needs a rx for pain medication. Pt states he called PMD office today and they were closed.

## 2013-03-14 NOTE — ED Notes (Signed)
Meds held until EDP speaks with neuro.

## 2013-03-15 ENCOUNTER — Encounter (HOSPITAL_BASED_OUTPATIENT_CLINIC_OR_DEPARTMENT_OTHER): Payer: Self-pay | Admitting: *Deleted

## 2014-01-30 ENCOUNTER — Emergency Department (INDEPENDENT_AMBULATORY_CARE_PROVIDER_SITE_OTHER): Payer: Self-pay

## 2014-01-30 ENCOUNTER — Encounter (HOSPITAL_COMMUNITY): Payer: Self-pay | Admitting: Emergency Medicine

## 2014-01-30 ENCOUNTER — Emergency Department (INDEPENDENT_AMBULATORY_CARE_PROVIDER_SITE_OTHER)
Admission: EM | Admit: 2014-01-30 | Discharge: 2014-01-30 | Disposition: A | Discharge status: Home / Self Care | Payer: Self-pay | Source: Home / Self Care | Attending: Family Medicine | Admitting: Family Medicine

## 2014-01-30 DIAGNOSIS — M5412 Radiculopathy, cervical region: Secondary | ICD-10-CM

## 2014-01-30 DIAGNOSIS — G629 Polyneuropathy, unspecified: Secondary | ICD-10-CM

## 2014-01-30 DIAGNOSIS — G589 Mononeuropathy, unspecified: Secondary | ICD-10-CM

## 2014-01-30 MED ORDER — METHYLPREDNISOLONE SODIUM SUCC 125 MG IJ SOLR
125.0000 mg | Freq: Once | INTRAMUSCULAR | Status: AC
  Administered 2014-01-30: 125 mg via INTRAMUSCULAR

## 2014-01-30 MED ORDER — METHYLPREDNISOLONE SODIUM SUCC 125 MG IJ SOLR
INTRAMUSCULAR | Status: AC
  Filled 2014-01-30: qty 2

## 2014-01-30 MED ORDER — METHYLPREDNISOLONE SODIUM SUCC 125 MG IJ SOLR: 125.0000 mg | Freq: Once | INTRAMUSCULAR | Status: DC

## 2014-01-30 NOTE — Discharge Instructions (Signed)
The cause of your symptoms is not immediately clear. There is no apparent need for emergent treatment to reverse your symptoms. This is likely due to slight nerve impingement in your spine causing discomfort and some weakness. This may also be due to some form of nutritional deficiency, autoimmune process, or othe medical condition.  Please start on high dose antiinflammatories such as ibuprofen 600-800mg  every 6-8 hours, daily stretching and range of motion exercises.  Please follow up with your regular doctor or spine specialist if your symptoms do not improve or go to the emergency room if you get significantly worse. You may benefit from a more thorough workup and medications.    Cervical Radiculopathy Cervical radiculopathy happens when a nerve in the neck is pinched or bruised by a slipped (herniated) disk or by arthritic changes in the bones of the cervical spine. This can occur due to an injury or as part of the normal aging process. Pressure on the cervical nerves can cause pain or numbness that runs from your neck all the way down into your arm and fingers. CAUSES  There are many possible causes, including:  Injury.  Muscle tightness in the neck from overuse.  Swollen, painful joints (arthritis).  Breakdown or degeneration in the bones and joints of the spine (spondylosis) due to aging.  Bone spurs that may develop near the cervical nerves. SYMPTOMS  Symptoms include pain, weakness, or numbness in the affected arm and hand. Pain can be severe or irritating. Symptoms may be worse when extending or turning the neck. DIAGNOSIS  Your caregiver will ask about your symptoms and do a physical exam. He or she may test your strength and reflexes. X-rays, CT scans, and MRI scans may be needed in cases of injury or if the symptoms do not go away after a period of time. Electromyography (EMG) or nerve conduction testing may be done to study how your nerves and muscles are working. TREATMENT    Your caregiver may recommend certain exercises to help relieve your symptoms. Cervical radiculopathy can, and often does, get better with time and treatment. If your problems continue, treatment options may include:  Wearing a soft collar for short periods of time.  Physical therapy to strengthen the neck muscles.  Medicines, such as nonsteroidal anti-inflammatory drugs (NSAIDs), oral corticosteroids, or spinal injections.  Surgery. Different types of surgery may be done depending on the cause of your problems. HOME CARE INSTRUCTIONS   Put ice on the affected area.  Put ice in a plastic bag.  Place a towel between your skin and the bag.  Leave the ice on for 15-20 minutes, 03-04 times a day or as directed by your caregiver.  If ice does not help, you can try using heat. Take a warm shower or bath, or use a hot water bottle as directed by your caregiver.  You may try a gentle neck and shoulder massage.  Use a flat pillow when you sleep.  Only take over-the-counter or prescription medicines for pain, discomfort, or fever as directed by your caregiver.  If physical therapy was prescribed, follow your caregiver's directions.  If a soft collar was prescribed, use it as directed. SEEK IMMEDIATE MEDICAL CARE IF:   Your pain gets much worse and cannot be controlled with medicines.  You have weakness or numbness in your hand, arm, face, or leg.  You have a high fever or a stiff, rigid neck.  You lose bowel or bladder control (incontinence).  You have trouble with walking,  balance, or speaking. MAKE SURE YOU:   Understand these instructions.  Will watch your condition.  Will get help right away if you are not doing well or get worse. Document Released: 03/16/2001 Document Revised: 09/13/2011 Document Reviewed: 02/02/2011 [facility] Patient Information 2015 [place], [place]. This information is not intended to replace advice given to you by your health care provider. Make sure  you discuss any questions you have with your health care provider.

## 2014-01-30 NOTE — ED Provider Notes (Addendum)
CSN: [id number]     Arrival date & time 01/30/14  1132 History   First MD Initiated Contact with Patient 01/30/14 1144     Chief Complaint  Patient presents with  . Numbness   (Consider location/radiation/quality/duration/timing/severity/associated sxs/prior Treatment) HPI  Numbness: started 1-2 mo ago. Started on L side in hand and foot. Slow progression up extremities. No on R hand. Sensation of numbness or digits being asleep. Now w/ some weakness on L. Denies palpitations, SOB, difficulty breathing, CP, fevers, rash, syncope, lighteadedness. Spinal surgery 10 years ago. nml diet. Denies trauma, loss of bowel or bladder fucntion   Past Medical History  Diagnosis Date  . Anxiety   . Spina bifida   . Depression    Past Surgical History  Procedure Laterality Date  . Back surgery     Family History  Problem Relation Age of Onset  . Mental illness Mother     PANIC ATTACKS  . Heart disease Father    History  Substance Use Topics  . Smoking status: Current Every Day Smoker -- 2.00 packs/day for 25 years    Types: Cigarettes  . Smokeless tobacco: Never Used  . Alcohol Use: No    Review of Systems Per HPI with all other pertinent systems negative.   Allergies  Review of patient's allergies indicates no known allergies.  Home Medications   Prior to Admission medications   Medication Sig Start Date End Date Taking? Authorizing Provider  acetaminophen (TYLENOL) 500 MG tablet Take 500 mg by mouth every 6 (six) hours as needed for pain.    Historical Provider, MD  ALPRAZolam Prudy Feeler) 0.5 MG tablet Take 0.5 mg by mouth at bedtime as needed for anxiety.    Historical Provider, MD  ibuprofen (ADVIL,MOTRIN) 200 MG tablet Take 200 mg by mouth every 6 (six) hours as needed for headache.    Historical Provider, MD  naproxen sodium (ANAPROX) 220 MG tablet Take 440 mg by mouth 2 (two) times daily with a meal.    Historical Provider, MD  SUMAtriptan (IMITREX) 25 MG tablet Take 1 tablet  (25 mg total) by mouth every 8 (eight) hours as needed for migraine. 03/14/13   April K Palumbo-Rasch, MD   BP 127/80  Pulse 84  Temp(Src) 99.1 F (37.3 C) (Oral)  Resp 16  SpO2 94% Physical Exam  Constitutional: He is oriented to person, place, and time. He appears well-developed and well-nourished. No distress.  HENT:  Head: Normocephalic and atraumatic.  Eyes: EOM are normal. Pupils are equal, round, and reactive to light.  Neck: Normal range of motion.  Cardiovascular: Normal rate and normal heart sounds.   No murmur heard. Pulmonary/Chest: Breath sounds normal.  Abdominal: Soft. He exhibits no distension.  Musculoskeletal:  Neck FROM.  Grip strength 4/5 on L w/ 4-5th digits slow to respond.  UE FROM. Sensation symmetrica LLE leg extension, plantar flexion and toe flexion 4/5. Sensation intact along the great toe on L and diminished in 2-5th toes. L lateral leg w/ decresed sensation.   Neurological: He is alert and oriented to person, place, and time. No cranial nerve deficit. Coordination normal.  Gait nml  Skin: Skin is warm. He is not diaphoretic.  Psychiatric: He has a normal mood and affect. Judgment and thought content normal.    ED Course  Procedures (including critical care time) Labs Review Labs Reviewed - No data to display  Imaging Review Dg Cervical Spine Complete  01/30/2014   CLINICAL DATA:  Neck pain.  EXAM:  CERVICAL SPINE  4+ VIEWS  COMPARISON:  None.  FINDINGS: No fracture or spondylolisthesis is noted. Moderate degenerative disc disease is noted at C5-6 and C6-7 with posterior osteophyte formation. Mild to moderate bilateral neural foraminal stenosis is noted at C5-6 secondary to uncovertebral spurring. Mild hypertrophy of posterior facet joints is seen bilaterally.  IMPRESSION: Degenerative disc disease is noted at C5-6 and C6-7 with bilateral neural foraminal stenosis at these levels secondary to uncovertebral spurring. No fracture or spondylolisthesis is  noted.   Electronically Signed   By: Roque LiasJames  Green M.D.   On: 01/30/2014 12:49   Dg Lumbar Spine Complete  01/30/2014   CLINICAL DATA:  Left-sided lower extremity numbness with difficulty walking; history of spina bifida and previous lumbar surgery  EXAM: LUMBAR SPINE - COMPLETE 4+ VIEW  COMPARISON:  None.  FINDINGS: The lumbar vertebral bodies are preserved in height. The disc space heights are well maintained. There is no spondylolisthesis. The facet joints are intact. 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 The pedicles and transverse processes are intact. The observed portions of the sacrum exhibit no acute abnormalities.  IMPRESSION: There is no significant disc space narrowing nor other degenerative change of the lumbar spine. 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   Electronically Signed   By: Rosangela Fehrenbach  [place]   On: 01/30/2014 12:33     MDM   1. Radiculopathy of cervical region   2. Neuropathy    Cervical and lumbar Radiculopathy: no sign of stroke, or other acute severe medical condition. May be related to Vit B12 deficiency, psychosomatic. Pt assessed by other staff who noted no deficits in ambulation getting on and off tables or holding objects.  Solumedrol 125 in office.  Start high dose NSAIDs and ROM exercises.  Pt w/ detailed instructions on when to seek emergent medical attention Pt to f/u w/ PCP or spine specialist in the next 4 wks if needed  Shelly Flattenavid Meriem Lemieux, MD Family Medicine 01/30/2014, 1:00 PM    Ozella Rocksavid J Kymoni Monday, MD 01/30/14 1300  Ozella Rocksavid J Elenie Coven, MD 01/30/14 1308

## 2014-01-30 NOTE — ED Notes (Signed)
Pt reports pain and numbness on left side of body x2 months; affecting the left hand and left foot Reports it's getting worse making it difficult to walk and to use left hand Hx of spina bifida  Smokes 1 PPD Denies inj/trauma, SOB, diaphoresis Alert w/no signs of acute distress; ambulated well to exam room w/NAD

## 2014-03-13 ENCOUNTER — Emergency Department (HOSPITAL_COMMUNITY): Payer: Self-pay

## 2014-03-13 ENCOUNTER — Emergency Department (HOSPITAL_COMMUNITY)
Admission: EM | Admit: 2014-03-13 | Discharge: 2014-03-13 | Disposition: A | Discharge status: Home / Self Care | Payer: Self-pay | Attending: Emergency Medicine | Admitting: Emergency Medicine

## 2014-03-13 ENCOUNTER — Encounter (HOSPITAL_COMMUNITY): Payer: Self-pay | Admitting: Emergency Medicine

## 2014-03-13 DIAGNOSIS — Z791 Long term (current) use of non-steroidal anti-inflammatories (NSAID): Secondary | ICD-10-CM | POA: Insufficient documentation

## 2014-03-13 DIAGNOSIS — F3289 Other specified depressive episodes: Secondary | ICD-10-CM | POA: Insufficient documentation

## 2014-03-13 DIAGNOSIS — R000 Tachycardia, unspecified: Secondary | ICD-10-CM | POA: Insufficient documentation

## 2014-03-13 DIAGNOSIS — M545 Low back pain, unspecified: Secondary | ICD-10-CM | POA: Insufficient documentation

## 2014-03-13 DIAGNOSIS — R300 Dysuria: Secondary | ICD-10-CM | POA: Insufficient documentation

## 2014-03-13 DIAGNOSIS — M5416 Radiculopathy, lumbar region: Secondary | ICD-10-CM

## 2014-03-13 DIAGNOSIS — IMO0002 Reserved for concepts with insufficient information to code with codable children: Secondary | ICD-10-CM | POA: Insufficient documentation

## 2014-03-13 DIAGNOSIS — F411 Generalized anxiety disorder: Secondary | ICD-10-CM | POA: Insufficient documentation

## 2014-03-13 DIAGNOSIS — F17200 Nicotine dependence, unspecified, uncomplicated: Secondary | ICD-10-CM | POA: Insufficient documentation

## 2014-03-13 DIAGNOSIS — F329 Major depressive disorder, single episode, unspecified: Secondary | ICD-10-CM | POA: Insufficient documentation

## 2014-03-13 LAB — URINALYSIS, ROUTINE W REFLEX MICROSCOPIC
Bilirubin Urine: NEGATIVE
GLUCOSE, UA: NEGATIVE mg/dL
HGB URINE DIPSTICK: NEGATIVE
Ketones, ur: NEGATIVE mg/dL
Leukocytes, UA: NEGATIVE
Nitrite: NEGATIVE
PH: 5.5 (ref 5.0–8.0)
Protein, ur: NEGATIVE mg/dL
SPECIFIC GRAVITY, URINE: 1.025 (ref 1.005–1.030)
Urobilinogen, UA: 0.2 mg/dL (ref 0.0–1.0)

## 2014-03-13 LAB — COMPREHENSIVE METABOLIC PANEL
ALT: 24 U/L (ref 0–53)
AST: 15 U/L (ref 0–37)
Albumin: 3.8 g/dL (ref 3.5–5.2)
Alkaline Phosphatase: 72 U/L (ref 39–117)
Anion gap: 16 — ABNORMAL HIGH (ref 5–15)
BUN: 18 mg/dL (ref 6–23)
CALCIUM: 9.1 mg/dL (ref 8.4–10.5)
CO2: 20 mEq/L (ref 19–32)
Chloride: 103 mEq/L (ref 96–112)
Creatinine, Ser: 0.95 mg/dL (ref 0.50–1.35)
GFR calc Af Amer: >90 mL/min (ref >90)
GFR calc non Af Amer: >90 mL/min (ref >90)
Glucose, Bld: 139 mg/dL — ABNORMAL HIGH (ref 70–99)
Potassium: 3.9 mEq/L (ref 3.7–5.3)
SODIUM: 139 meq/L (ref 137–147)
TOTAL PROTEIN: 7.4 g/dL (ref 6.0–8.3)
Total Bilirubin: 0.5 mg/dL (ref 0.3–1.2)

## 2014-03-13 LAB — CBC WITH DIFFERENTIAL/PLATELET
BASOS ABS: 0.1 10*3/uL (ref 0.0–0.1)
Basophils Relative: 1 % (ref 0–1)
EOS ABS: 0.1 10*3/uL (ref 0.0–0.7)
EOS PCT: 1 % (ref 0–5)
HCT: 46.8 % (ref 39.0–52.0)
Hemoglobin: 17.3 g/dL — ABNORMAL HIGH (ref 13.0–17.0)
LYMPHS PCT: 25 % (ref 12–46)
Lymphs Abs: 2.2 10*3/uL (ref 0.7–4.0)
MCH: 32 pg (ref 26.0–34.0)
MCHC: 37 g/dL — ABNORMAL HIGH (ref 30.0–36.0)
MCV: 86.5 fL (ref 78.0–100.0)
Monocytes Absolute: 0.4 10*3/uL (ref 0.1–1.0)
Monocytes Relative: 4 % (ref 3–12)
NEUTROS PCT: 69 % (ref 43–77)
Neutro Abs: 5.9 10*3/uL (ref 1.7–7.7)
PLATELETS: 198 10*3/uL (ref 150–400)
RBC: 5.41 MIL/uL (ref 4.22–5.81)
RDW: 12.4 % (ref 11.5–15.5)
WBC: 8.7 10*3/uL (ref 4.0–10.5)

## 2014-03-13 MED ORDER — HYDROCODONE-ACETAMINOPHEN 5-325 MG PO TABS: 1.0000 | ORAL_TABLET | ORAL | Status: DC | PRN

## 2014-03-13 MED ORDER — OXYCODONE-ACETAMINOPHEN 5-325 MG PO TABS
1.0000 | ORAL_TABLET | Freq: Once | ORAL | Status: AC
  Administered 2014-03-13: 1 via ORAL
  Filled 2014-03-13: qty 1

## 2014-03-13 MED ORDER — GADOBENATE DIMEGLUMINE 529 MG/ML IV SOLN
20.0000 mL | Freq: Once | INTRAVENOUS | Status: AC | PRN
  Administered 2014-03-13: 20 mL via INTRAVENOUS

## 2014-03-13 MED ORDER — ONDANSETRON HCL 4 MG/2ML IJ SOLN
4.0000 mg | Freq: Once | INTRAMUSCULAR | Status: AC
  Administered 2014-03-13: 4 mg via INTRAVENOUS
  Filled 2014-03-13: qty 2

## 2014-03-13 MED ORDER — MORPHINE SULFATE 4 MG/ML IJ SOLN
4.0000 mg | Freq: Once | INTRAMUSCULAR | Status: AC
  Administered 2014-03-13: 4 mg via INTRAVENOUS
  Filled 2014-03-13: qty 1

## 2014-03-13 NOTE — ED Notes (Signed)
MD at bedside. 

## 2014-03-13 NOTE — ED Notes (Addendum)
Pt presents to department for evaluation of neck and lower back pain, also reports numbness/tingling to L arm and hand. Ongoing x2 months. 8/10 pain upon arrival. L sided weakness noted, L grip strength weaker than R, also states numbness to L leg. Pt is alert and oriented x4. States he was seen at urgent care facility several months ago and given prednisone with some relief.

## 2014-03-13 NOTE — ED Notes (Addendum)
Patient transported to MRI 

## 2014-03-13 NOTE — ED Provider Notes (Signed)
CSN: [id number]     Arrival date & time 03/13/14  1159 History   First MD Initiated Contact with Patient 03/13/14 1517     Chief Complaint  Patient presents with  . Neck Pain  . Back Pain  . Numbness     (Consider location/radiation/quality/duration/timing/severity/associated sxs/prior Treatment) HPI Comments: The patient is a 43 year old male with past history of spina bifida status post surgery, tobacco abuse, presents emergency room chief complaint of worsening numbness to left upper extremity and left lower extremity. The patient reports initially numbness was intermittent about 2 months ago. He reports progress and to left foot and left hand with associated weakness. He denies recent injury.  He reports he was evaluated at urgent care at the end of July and was given prednisone for his "inflamed spinal cord". Pt reports no relief with prednisone. Pt reports he has trouble making it to the "restroom in time", reports decrease in urge until it's too late. Denies bowel incontinent or constipation. Denies saddle paresthesias. NO PCP  The history is provided by the patient. No language interpreter was used.    Past Medical History  Diagnosis Date  . Anxiety   . Spina bifida   . Depression    Past Surgical History  Procedure Laterality Date  . Back surgery     Family History  Problem Relation Age of Onset  . Mental illness Mother     PANIC ATTACKS  . Heart disease Father    History  Substance Use Topics  . Smoking status: Current Every Day Smoker -- 2.00 packs/day for 25 years    Types: Cigarettes  . Smokeless tobacco: Never Used  . Alcohol Use: No    Review of Systems  Constitutional: Negative for fever and chills.  Gastrointestinal: Negative for abdominal pain.  Genitourinary: Positive for difficulty urinating.  Musculoskeletal: Positive for back pain and neck pain.  Skin: Negative for color change and wound.  Neurological: Positive for weakness and numbness.       Allergies  Review of patient's allergies indicates no known allergies.  Home Medications   Prior to Admission medications   Medication Sig Start Date End Date Taking? Authorizing Provider  acetaminophen (TYLENOL) 500 MG tablet Take 500 mg by mouth every 6 (six) hours as needed for pain.    Historical Provider, MD  ALPRAZolam Prudy Feeler) 0.5 MG tablet Take 0.5 mg by mouth at bedtime as needed for anxiety.    Historical Provider, MD  ibuprofen (ADVIL,MOTRIN) 200 MG tablet Take 200 mg by mouth every 6 (six) hours as needed for headache.    Historical Provider, MD  naproxen sodium (ANAPROX) 220 MG tablet Take 440 mg by mouth 2 (two) times daily with a meal.    Historical Provider, MD  SUMAtriptan (IMITREX) 25 MG tablet Take 1 tablet (25 mg total) by mouth every 8 (eight) hours as needed for migraine. 03/14/13   April K Palumbo-Rasch, MD   BP 115/72  Pulse 97  Temp(Src) 98.4 F (36.9 C) (Oral)  Resp 16  SpO2 98% Physical Exam  Vitals reviewed. Constitutional: He is oriented to person, place, and time. He appears well-developed and well-nourished. No distress.  HENT:  Head: Normocephalic and atraumatic.  Neck: Normal range of motion. Neck supple.  Cardiovascular: Regular rhythm.  Tachycardia present.   Pulses:      Radial pulses are 2+ on the right side, and 2+ on the left side.  Pulmonary/Chest: Effort normal. No respiratory distress. He has no wheezes. He has no  rales.  Abdominal: Soft. There is no tenderness. There is no rebound.  Musculoskeletal: Normal range of motion.  Well healed low midline scar, no tenderness, surrounding erythema.  Neurological: He is alert and oriented to person, place, and time. He is not disoriented. GCS eye subscore is 4. GCS verbal subscore is 5. GCS motor subscore is 6.  Reflex Scores:      Bicep reflexes are 2+ on the right side and 2+ on the left side.      Patellar reflexes are 2+ on the right side and 3+ on the left side. Limping gait.  Degrease  grip strength to left upper extremity. Decrease in strength in flexion and extention to left lower extremity. Limping gait.  Skin: Skin is warm and dry. He is not diaphoretic.  Psychiatric: He has a normal mood and affect. His behavior is normal.    ED Course  Procedures (including critical care time) Labs Review Labs Reviewed  CBC WITH DIFFERENTIAL - Abnormal; Notable for the following:    Hemoglobin 17.3 (*)    MCHC 37.0 (*)    All other components within normal limits  COMPREHENSIVE METABOLIC PANEL - Abnormal; Notable for the following:    Glucose, Bld 139 (*)    Anion gap 16 (*)    All other components within normal limits  URINALYSIS, ROUTINE W REFLEX MICROSCOPIC    Imaging Review Ct Head Wo Contrast  03/13/2014   CLINICAL DATA:  Neck pain and back pain  EXAM: CT HEAD WITHOUT CONTRAST  TECHNIQUE: Contiguous axial images were obtained from the base of the skull through the vertex without intravenous contrast.  COMPARISON:  CT head 01/01/2013  FINDINGS: Ventricle size is normal. Negative for acute or chronic infarction. Negative for hemorrhage or fluid collection. Negative for mass or edema. No shift of the midline structures.  Calvarium is intact.  IMPRESSION: Normal   Electronically Signed   By: Marlan Palau M.D.   On: 03/13/2014 17:27   Mr Lumbar Spine W Wo Contrast  03/13/2014   CLINICAL DATA:  Two month history of weakness and numbness, left lower extremity weakness. History of spina bifida.  EXAM: MRI LUMBAR SPINE WITHOUT AND WITH CONTRAST  TECHNIQUE: Multiplanar and multiecho pulse sequences of the lumbar spine were obtained without and with intravenous contrast.  CONTRAST:  20mL MULTIHANCE GADOBENATE DIMEGLUMINE 529 MG/ML IV SOLN  COMPARISON:  Lumbar spine radiographs January 30, 2014  FINDINGS: Lumbar vertebral bodies are intact and aligned with maintenance of the lumbar lordosis. Using the reference level of the last well-formed intervertebral disc as L5-S1, mild L2-3 disc height  loss, decreased T2 signal within the L2-3 through L5-S1 disc consistent with mild desiccation. No STIR signal abnormality to suggest acute osseous process. No abnormal osseous nor intradiscal enhancement.  Low-lying conus at L3-4. No abnormal cord, leptomeningeal nor epidural enhancement. No abnormal spinal cord signal. Thickened appearance of the filum terminale at 3 mm. Multifidi muscle atrophy associated with postoperative changes at L4-5 caudally.  Level by level evaluation:  L1-2: No disc bulge, canal stenosis nor neural foraminal narrowing.  L2-3: Minimal annular bulging, no canal stenosis or neural foraminal narrowing.  L3-4: Minimal annular bulging with superimposed 4 mm left extra foraminal broad-based disc protrusion with enhancing annular tear, posteriorly displacing the exited left L3 nerve (axial 18/36). No canal stenosis. Mild to moderate left neural foraminal narrowing.  L4-5: Minimal left extra foraminal broad-based disc protrusion, with enhancing annular fissure. Mild facet arthropathy and ligamentum flavum redundancy without canal stenosis.  Mild bilateral neural foraminal narrowing.  L5-S1: Bilateral laminectomies and right facetectomy. No canal stenosis. Minimal annular bulging. Mild bilateral neural foraminal narrowing.  IMPRESSION: Moderate subacute appearing L3-4 left extra foraminal disc protrusion displacing the exited left L3 nerve. Minimal L4-5 left extra foraminal disc protrusion.  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  No acute fracture or malalignment.  No canal stenosis. L3-4 through L5-S1 neural foraminal narrowing: Mild to moderate on the left at L3-4.   Electronically Signed   By: Awilda Metro   On: 03/13/2014 23:16     EKG Interpretation None      MDM   Final diagnoses:  Left lumbar radiculopathy   Patient presents with decreased grip strength of left upper  extremity and left lower extremity decreased strength and hyperreflexia.  EMR shows degenerative changes in cervical spine. 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 No fracture or spondylolisthesis is noted. IMPRESSION: There is no significant disc space narrowing nor other degenerative change of the lumbar spine. 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 Discussed patient history, condition with Dr. Clarice Pole who advises CT head. & CT head without acute findings. Re-eval pt agrees to stay for MRI of Lumbar spine. The patient was evaluated by Dr. Donnald Garre who placed order for MRI of lumbar spine. MRI shows Subacute mild to moderate foraminal narrowing on Left L3-4.  Will have pt follow up with neurosurgeon, Reinaldo Meeker, previous treated the patient.  Discussed lab results, imaging results, and treatment plan with the patient. Return precautions given. Reports understanding and no other concerns at this time.  Patient is stable for discharge at this time.  Meds given in ED:  Medications  ondansetron [facility]) injection 4 mg (4 mg Intravenous Given 03/13/14 2108)  morphine 4 MG/ML injection 4 mg (4 mg Intravenous Given 03/13/14 2108)  gadobenate dimeglumine (MULTIHANCE) injection 20 mL (20 mLs Intravenous Contrast Given 03/13/14 2253)  oxyCODONE-acetaminophen (PERCOCET/ROXICET) 5-325 MG per tablet 1 tablet (1 tablet Oral Given 03/13/14 2347)    Discharge Medication List as of 03/13/2014 11:31 PM    START taking these medications   Details  HYDROcodone-acetaminophen (NORCO/VICODIN) 5-325 MG per tablet Take 1 tablet by mouth every 4 (four) hours as needed for moderate pain or severe pain., Starting 03/13/2014, Until Discontinued, Print               Mellody Drown, PA-C 03/14/14 [phone]

## 2014-03-13 NOTE — ED Provider Notes (Signed)
Patient has been evaluated by myself in conjunction with physician's assistant. He has been expressing increasing weakness and numbness over several months duration. At this point time he is ambulatory but reports that he has to take extra care when using his left side.  I have examined the patient he is alert he has no mental status compromise. He is able to stand in the room and ambulate. The lower extremities are well-developed does not appear to be any chronic atrophy related problems. He has a mildly antalgic gait.  At this point in time plan will be to get an MRI of the lumbar/sacral spine. Pending results the plan will be determined for ongoing outpatient evaluation with neurology and/or neurosurgery.  [name]9/09/15 [phone]

## 2014-03-13 NOTE — Discharge Instructions (Signed)
Call for a follow up appointment with your neurosurgeon, or one listed for further evaluation and treatment of your Left leg weakness and numbness. Call for a follow up appointment with a Family or Primary Care Provider.  Return if Symptoms worsen.   Take medication as prescribed.  Do not operate heavy machinery while taking narcotic pain medication.     Emergency Department Resource Guide 1) Find a Doctor and Pay Out of Pocket Although you won't have to find out who is covered by your insurance plan, it is a good idea to ask around and get recommendations. You will then need to call the office and see if the doctor you have chosen will accept you as a new patient and what types of options they offer for patients who are self-pay. Some doctors offer discounts or will set up payment plans for their patients who do not have insurance, but you will need to ask so you aren't surprised when you get to your appointment.  2) Contact Your Local Health Department Not all health departments have doctors that can see patients for sick visits, but many do, so it is worth a call to see if yours does. If you don't know where your local health department is, you can check in your phone book. The CDC also has a tool to help you locate your state's health department, and many state websites also have listings of all of their local health departments.  3) Find a Walk-in Clinic If your illness is not likely to be very severe or complicated, you may want to try a walk in clinic. These are popping up all over the country in pharmacies, drugstores, and shopping centers. They're usually staffed by nurse practitioners or physician assistants that have been trained to treat common illnesses and complaints. They're usually fairly quick and inexpensive. However, if you have serious medical issues or chronic medical problems, these are probably not your best option.  No Primary Care Doctor: - Call Health Connect at  [phone]  - they can help you locate a primary care doctor that  accepts your insurance, provides certain services, etc. - Physician Referral Service- [phone]  Chronic Pain Problems: Organization         Address  Phone   Notes  [facility]  [phone] Patients need to be referred by their primary care doctor.   Medication Assistance: Organization         Address  Phone   Notes  [facility] Medication [facility] [address] [place] [place], [place] [address] [phone] --Must be a resident of [facility] -- Must have NO insurance coverage whatsoever (no Medicaid/ Medicare, etc.) -- The pt. MUST have a primary care doctor that directs their care regularly and follows them in the community   Med[facility]ist  [phone]   [employer]  [phone]    Agencies that provide inexpensive medical care: Organization         Address  Phone   Notes  [facility]  [phone]   [facility]    [phone]   [facility] [address] [place], [place] [address] [phone]   [facility] [address] [place], [place] [phone]   Planned Parenthood    [phone]   [facility]    [phone]   [facility]  [address] [place], [employer] Phone:  [phone], Fax:  [phone] Hours of Operation:  9 am - 6 pm, M-F.  Also accepts Medicaid/Medicare and self-pay.  [facility] [place], [address], [place] Phone: [phone], Fax: [phone]. Hours of Operation:  8:30 am - 5:30 pm, M-F.  Also accepts Medicaid and self-pay.  [facility] [address], [place] Phone: [phone]   [place], [place], [place] [phone], Ext. [phone] Mondays & Thursdays: 7-9 AM.  First 15 patients are seen on a first come, first serve basis.     [place] Providers:  Organization         Address  Phone   Notes  [facility] [address],  [phone] Also accepts self-pay patients.  [facility] [address] [place], [place]  [phone]   [place], [address], [place] [phone]   [facility] [address] [place] [phone]   Lucianne Lei [address], [place]   [phone] Only accepts [place] Access [place] patients after they have their name applied to their card.   Self-Pay (no insurance) in [facility]:  Organization         Address  Phone   Notes  Sickle Cell Patients, [facility] [place] [facility] Internal Medicine [place] [phone]   [facility] [place] [phone]   [facility] [place]  [place], [place], [place] [phone]   [facility]/Dr. Osei-Bonsu  [address], [place] or [address], [place] [phone] Phone number for both [place] and [place] locations is the same.  Urgent Medical and [facility] [address] [place] [phone]   [facility] [address] [place] or [address] [phone]   [facility] [address], [place] [phone], phone; [phone], fax Sees patients 1st and 3rd Saturday of every month.  Must not qualify for public or private insurance (i.e. Medicaid, Medicare, [place] Health Choice, Veterans' Benefits)  Household income should be no more than 200% of the poverty level The clinic cannot treat you if you are pregnant or think you are pregnant  Sexually transmitted diseases are not treated at the clinic.    Dental Care: Organization         Address  Phone  Notes  [facility]  Department of [facility] [place] [phone] Accepts children up to age 68 who are enrolled in [place] or [place]; pregnant women with a Medicaid card; and children who have applied for Medicaid or [place] Health Choice, but were declined, whose parents can pay a reduced fee at time of service.  [facility] Department of [facility]  [address], [place] [phone] Accepts children up to age 61 who are enrolled in [place] or [place]; pregnant women with a Medicaid card; and children who have applied for Medicaid or [place] Health Choice, but were declined, whose parents can pay a reduced fee at time of service.  [place] [facility] Access PRO[facility]  [place] [phone] Patients are seen by appointment only. Walk-ins  are not accepted. [place] will see patients 35 years of age and older. Monday - Tuesday (8am-5pm) Most Wednesdays (8:30-5pm) $30 per visit, cash only  [facility] Adult [facility] Access PRO[facility]  [address], [facility] [phone] Patients are seen by appointment only. Walk-ins are not accepted. [place] will see patients 30 years of age and older. One Wednesday Evening (Monthly: Volunteer Based).  $30 per visit, cash only  [place]  [phone] for adults; Children under age 99, call Graduate Pediatric Dentistry at [phone]. Children aged 48-14, please call [phone] to request a pediatric application.  Dental services are provided in all areas of dental care including fillings, crowns and bridges, complete and partial dentures, implants, gum treatment, root canals, and extractions. Preventive care is also provided. Treatment is provided to both adults and children. Patients are selected via a lottery and there is often a waiting list.   [facility] [address], [place]  [phone]  [email or web address]   [facility] [address] [place], [place] [phone], Ext. [phone] Second and Fourth Thursday of each month, opens at 6:30 AM; Clinic ends at 9 AM.  Patients are seen on a first-come first-served basis, and a limited number are seen during each clinic.   [facility]  [address] [place] Danker [place], [place] [phone]   Eligibility Requirements You must have lived in [place], [place], or [place] counties for at least the last three months.   You cannot be eligible for state or federal sponsored [employer], including [employer], [place], or [employer].   You generally cannot be eligible for healthcare insurance through your employer.    How to apply: Eligibility screenings are held every Tuesday and Wednesday afternoon from 1:00 pm until 4:00 pm. You do not need an appointment for the interview!  [facility] [address] [place], [place]   [place] Department  [place]  [phone]    Behavioral Health Resources in the Community: Intensive Outpatient Programs Organization         Address  Phone  Notes  [place]. [address], [place], [place] [phone]   [facility] Outpatient [address] [place], [place]   ADS: Alcohol & Drug Svcs [address], [place], [place]   [place] [address]  [place], [place] or [phone]   Substance Abuse Resources Organization         Address  Phone  Notes  Alcohol and Drug Services  [phone]   [place]  [phone]   [facility] [place]   Chinita Pester  [phone]   Residential & Outpatient Substance Abuse Program  [phone]   Psychological Services Organization          Address  Phone  Notes  [facility] [place]  [place]  [phone]   [place] [address], [place] [phone] or [phone]    Mobile Crisis Teams Organization         Address  Phone  Notes  [facility], Mobile Crisis Care Unit  [phone]   Assertive Psychotherapeutic Services  [address] [place], [place]   [facility] [address], [place] [phone]  Self-Help/Support Groups Organization         Address  Phone             Notes  [facility]ental Health Assoc[facility] of [place] - variety of support groups  [place] Call for more information  Narcotics Anonymous (NA), [facility] [address], [employer] [place]  2 meetings at this location   Special educational needs teacher         Address  Phone  Notes  ASAP Residential Treatment [place],    [place]  [phone]   [facility]  [address], [place] [phone], [place], [place]   [place], [place] [phone] Admissions: 8am-3pm M-F  Incentives Substance [place] [address]    [place], [place] [phone]   [facility] [address] [place], [place], [place]   [facility] [address],  [place], [place]   [facility] Programs - Intensive Outpatient [address] Kristeen [address], [place], [place]   [facility] [address]  [place], [place] [phone] or [phone]   [facility] Services ([facility]) [address], [place], [place] Accepts Medicaid  [facility] [place] [address],  [place] [phone] Substance Abuse/Addiction Treatment   [facility] Organization         Address  Phone  Notes  [facility]  [phone]   Domenic Schwab, PhD [address] [place], [place]   [phone] or [phone]   [place] [facility] [place], [place] [phone]   [facility] [address], [place], [place] [phone] Insurance/Medicaid/sponsorship through [facility] and Families [address], [address] [place]                                    [place], [place] [phone] [place] [address], [place] [phone]    Dr. Adele Schilder  [phone]   [facility] Dept. 1) [address] [place] 2) [place] 3)  [place] [address], [place] [phone]  [phone]   [place] [phone] or [phone] (After Hours)

## 2014-03-14 NOTE — ED Provider Notes (Signed)
Medical screening examination/treatment/procedure(s) were conducted as a shared visit with non-physician practitioner(s) and myself.  I personally evaluated the patient during the encounter.   EKG Interpretation None       Samuell Knoble, MD 03/14/14 0051 

## 2014-04-02 ENCOUNTER — Ambulatory Visit: Payer: Self-pay | Attending: Internal Medicine | Admitting: Internal Medicine

## 2014-04-02 ENCOUNTER — Encounter: Payer: Self-pay | Admitting: Internal Medicine

## 2014-04-02 VITALS — BP 117/76 | HR 80 | Temp 98.0°F | Resp 16 | Wt 250.0 lb

## 2014-04-02 DIAGNOSIS — M545 Low back pain, unspecified: Secondary | ICD-10-CM | POA: Insufficient documentation

## 2014-04-02 DIAGNOSIS — Z139 Encounter for screening, unspecified: Secondary | ICD-10-CM

## 2014-04-02 DIAGNOSIS — F329 Major depressive disorder, single episode, unspecified: Secondary | ICD-10-CM | POA: Insufficient documentation

## 2014-04-02 DIAGNOSIS — Z9889 Other specified postprocedural states: Secondary | ICD-10-CM

## 2014-04-02 DIAGNOSIS — G8929 Other chronic pain: Secondary | ICD-10-CM | POA: Insufficient documentation

## 2014-04-02 DIAGNOSIS — M542 Cervicalgia: Secondary | ICD-10-CM | POA: Insufficient documentation

## 2014-04-02 DIAGNOSIS — M9981 Other biomechanical lesions of cervical region: Secondary | ICD-10-CM

## 2014-04-02 DIAGNOSIS — F3289 Other specified depressive episodes: Secondary | ICD-10-CM | POA: Insufficient documentation

## 2014-04-02 DIAGNOSIS — F17200 Nicotine dependence, unspecified, uncomplicated: Secondary | ICD-10-CM | POA: Insufficient documentation

## 2014-04-02 DIAGNOSIS — F411 Generalized anxiety disorder: Secondary | ICD-10-CM | POA: Insufficient documentation

## 2014-04-02 DIAGNOSIS — Z23 Encounter for immunization: Secondary | ICD-10-CM | POA: Insufficient documentation

## 2014-04-02 DIAGNOSIS — M4802 Spinal stenosis, cervical region: Secondary | ICD-10-CM | POA: Insufficient documentation

## 2014-04-02 DIAGNOSIS — M5126 Other intervertebral disc displacement, lumbar region: Secondary | ICD-10-CM | POA: Insufficient documentation

## 2014-04-02 MED ORDER — TRAMADOL HCL 50 MG PO TABS: 50.0000 mg | ORAL_TABLET | Freq: Three times a day (TID) | ORAL | Status: DC | PRN

## 2014-04-02 MED ORDER — GABAPENTIN 300 MG PO CAPS: 300.0000 mg | ORAL_CAPSULE | Freq: Three times a day (TID) | ORAL | Status: DC

## 2014-04-02 NOTE — Progress Notes (Signed)
Patient here to establish care Complains of back pain and numbness to his left arm States has some bulging discs in his back

## 2014-04-02 NOTE — Progress Notes (Signed)
Patient Demographics  Paul Baldwin, is a 43 y.o. male  WUJ:[id number]  NFA:[id number]  DOB - 11/10/1970  CC:  Chief Complaint  Patient presents with  . Establish Care       HPI: Paul Baldwin is a 43 y.o. male here today to establish medical care. Patient has history of chronic back pain, have been recently to urgent care and emergency room with symptoms of neck pain low back pain, EMR reviewed patient had MRI of lower lumbar spine done which reported disc protrusion, patient also had C-spine x-ray done in July which reported neuroforaminal stenosis, patient does complain of difficulty with her left hand grip and numbness tingling in left arm sometimes in the right arm , patient has history of a spina bifida and so had surgery done, patient also has history of anxiety and occasionally takes Xanax. Patient has No headache, No chest pain, No abdominal pain - No Nausea,  No Cough - SOB.  No Known Allergies Past Medical History  Diagnosis Date  . Anxiety   . Spina bifida   . Depression    Current Outpatient Prescriptions on File Prior to Visit  Medication Sig Dispense Refill  . ALPRAZolam (XANAX) 0.5 MG tablet Take 0.5 mg by mouth at bedtime as needed for anxiety.      Marland Kitchen. HYDROcodone-acetaminophen (NORCO/VICODIN) 5-325 MG per tablet Take 1 tablet by mouth every 4 (four) hours as needed for moderate pain or severe pain.  10 tablet  0  . ibuprofen (ADVIL,MOTRIN) 200 MG tablet Take 800 mg by mouth every 6 (six) hours as needed for headache or moderate pain.        No current facility-administered medications on file prior to visit.   Family History  Problem Relation Age of Onset  . Mental illness Mother     PANIC ATTACKS  . Heart disease Father   . Spina bifida Sister   . Cancer Paternal Grandfather    History   Social History  . Marital Status: Single    Spouse Name: N/A    Number of Children: N/A  . Years of Education: N/A   Occupational History  . Not on  file.   Social History Main Topics  . Smoking status: Current Every Day Smoker -- 1.00 packs/day for 30 years    Types: Cigarettes  . Smokeless tobacco: Never Used  . Alcohol Use: No  . Drug Use: No  . Sexual Activity: Not Currently   Other Topics Concern  . Not on file   Social History Narrative   ** Merged History Encounter **        Review of Systems: Constitutional: Negative for fever, chills, diaphoresis, activity change, appetite change and fatigue. HENT: Negative for ear pain, nosebleeds, congestion, facial swelling, rhinorrhea, neck pain, neck stiffness and ear discharge.  Eyes: Negative for pain, discharge, redness, itching and visual disturbance. Respiratory: Negative for cough, choking, chest tightness, shortness of breath, wheezing and stridor.  Cardiovascular: Negative for chest pain, palpitations and leg swelling. Gastrointestinal: Negative for abdominal distention. Genitourinary: Negative for dysuria, urgency, frequency, hematuria, flank pain, decreased urine volume, difficulty urinating and dyspareunia.  Musculoskeletal: Negative for back pain, joint swelling, arthralgia and gait problem. Neurological: Negative for dizziness, tremors, seizures, syncope, facial asymmetry, speech difficulty, weakness, light-headedness, numbness and headaches.  Hematological: Negative for adenopathy. Does not bruise/bleed easily. Psychiatric/Behavioral: Negative for hallucinations, behavioral problems, confusion, dysphoric mood, decreased concentration and agitation.    Objective:   Filed Vitals:   04/02/14 1600  BP: 117/76  Pulse: 80  Temp: 98 F (36.7 C)  Resp: 16    Physical Exam: Constitutional: Patient appears well-developed and well-nourished. No distress. HENT: Normocephalic, atraumatic, External right and left ear normal. Oropharynx is clear and moist.  Eyes: Conjunctivae and EOM are normal. PERRLA, no scleral icterus. Neck: Normal ROM. Neck supple. No JVD. No  tracheal deviation. No thyromegaly. CVS: RRR, S1/S2 +, no murmurs, no gallops, no carotid bruit.  Pulmonary: Effort and breath sounds normal, no stridor, rhonchi, wheezes, rales.  Abdominal: Soft. BS +, no distension, tenderness, rebound or guarding.  Musculoskeletal: Low lumbar old midline surgical scar tenderness on the plantar spinal musculature, with SLR test patient complaining of back discomfort, strength 4/5 in left lower extremity.  Neuro: Alert. Normal reflexes, muscle tone coordination. No cranial nerve deficit. Skin: Skin is warm and dry. No rash noted. Not diaphoretic. No erythema. No pallor. Psychiatric: Normal mood and affect. Behavior, judgment, thought content normal.  Lab Results  Component Value Date   WBC 8.7 03/13/2014   HGB 17.3* 03/13/2014   HCT 46.8 03/13/2014   MCV 86.5 03/13/2014   PLT 198 03/13/2014   Lab Results  Component Value Date   CREATININE 0.95 03/13/2014   BUN 18 03/13/2014   NA 139 03/13/2014   K 3.9 03/13/2014   CL 103 03/13/2014   CO2 20 03/13/2014    No results found for this basename: HGBA1C   Lipid Panel  No results found for this basename: chol,  trig,  hdl,  cholhdl,  vldl,  ldlcalc       Assessment and plan:   1. Protruded lumbar disc/ Neural foraminal stenosis of cervical spine/2. Cervicalgia  - Ambulatory referral to Neurosurgery   5. Screening Ordered baseline blood work.  - CBC with Differential - COMPLETE METABOLIC PANEL WITH GFR - TSH - Vit D  25 hydroxy (rtn osteoporosis monitoring)  6. History of back surgery/Chronic low back pain Advised patient to apply heating pad, prescribed Neurontin and tramadol - traMADol (ULTRAM) 50 MG tablet; Take 1 tablet (50 mg total) by mouth every 8 (eight) hours as needed for moderate pain.  Dispense: 30 tablet; Refill: 0 - gabapentin (NEURONTIN) 300 MG capsule; Take 1 capsule (300 mg total) by mouth 3 (three) times daily.  Dispense: 90 capsule; Refill: 3  7. Need for prophylactic vaccination and  inoculation against influenza Flu shot given today.  Smoking Patient smokes one pack per day, I have advised patient to quit smoking, patient is not ready yet, he will think about it.  Return in about 3 months (around 07/02/2014) for back pain.  Doris Cheadle, MD

## 2014-04-03 ENCOUNTER — Telehealth: Payer: Self-pay | Admitting: *Deleted

## 2014-04-03 LAB — CBC WITH DIFFERENTIAL/PLATELET
Basophils Absolute: 0.1 10*3/uL (ref 0.0–0.1)
Basophils Relative: 1 % (ref 0–1)
Eosinophils Absolute: 0.1 10*3/uL (ref 0.0–0.7)
Eosinophils Relative: 1 % (ref 0–5)
HCT: 46.5 % (ref 39.0–52.0)
HEMOGLOBIN: 16.3 g/dL (ref 13.0–17.0)
LYMPHS ABS: 2.6 10*3/uL (ref 0.7–4.0)
Lymphocytes Relative: 30 % (ref 12–46)
MCH: 30.2 pg (ref 26.0–34.0)
MCHC: 35.1 g/dL (ref 30.0–36.0)
MCV: 86.3 fL (ref 78.0–100.0)
MONOS PCT: 5 % (ref 3–12)
Monocytes Absolute: 0.4 10*3/uL (ref 0.1–1.0)
NEUTROS PCT: 63 % (ref 43–77)
Neutro Abs: 5.4 10*3/uL (ref 1.7–7.7)
PLATELETS: 218 10*3/uL (ref 150–400)
RBC: 5.39 MIL/uL (ref 4.22–5.81)
RDW: 12.8 % (ref 11.5–15.5)
WBC: 8.6 10*3/uL (ref 4.0–10.5)

## 2014-04-03 LAB — COMPLETE METABOLIC PANEL WITH GFR
ALK PHOS: 73 U/L (ref 39–117)
ALT: 32 U/L (ref 0–53)
AST: 15 U/L (ref 0–37)
Albumin: 4.5 g/dL (ref 3.5–5.2)
BUN: 17 mg/dL (ref 6–23)
CO2: 22 meq/L (ref 19–32)
CREATININE: 1.14 mg/dL (ref 0.50–1.35)
Calcium: 9.5 mg/dL (ref 8.4–10.5)
Chloride: 104 mEq/L (ref 96–112)
GFR, EST NON AFRICAN AMERICAN: 78 mL/min
GLUCOSE: 94 mg/dL (ref 70–99)
Potassium: 3.5 mEq/L (ref 3.5–5.3)
Sodium: 134 mEq/L — ABNORMAL LOW (ref 135–145)
Total Bilirubin: 0.5 mg/dL (ref 0.2–1.2)
Total Protein: 7.1 g/dL (ref 6.0–8.3)

## 2014-04-03 LAB — TSH: TSH: 1.376 u[IU]/mL (ref 0.350–4.500)

## 2014-04-03 LAB — VITAMIN D 25 HYDROXY (VIT D DEFICIENCY, FRACTURES): Vit D, 25-Hydroxy: 22 ng/mL — ABNORMAL LOW (ref 30–89)

## 2014-04-03 MED ORDER — VITAMIN D (ERGOCALCIFEROL) 1.25 MG (50000 UNIT) PO CAPS
50000.0000 [IU] | ORAL_CAPSULE | ORAL | Status: DC
Start: 2014-04-03 — End: 2016-02-20

## 2014-04-03 NOTE — Telephone Encounter (Signed)
Left voice massage to return call, Rx Ergocalciferol e-screen to pt pharmacy of choice

## 2014-04-03 NOTE — Telephone Encounter (Signed)
Message copied by Dyann KiefGIRALDEZ, Velna Hedgecock M on Wed Apr 03, 2014  5:10 PM ------      Message from: Doris CheadleADVANI, DEEPAK      Created: Wed Apr 03, 2014  9:28 AM       Blood work reviewed, noticed low vitamin D, call patient advise to start ergocalciferol 50,000 units once a week for the duration of  12 weeks.       ------

## 2014-04-09 ENCOUNTER — Telehealth: Payer: Self-pay | Admitting: Internal Medicine

## 2014-04-09 ENCOUNTER — Other Ambulatory Visit: Payer: Self-pay | Admitting: Internal Medicine

## 2014-04-09 NOTE — Telephone Encounter (Signed)
Patient has called in today to request a refill for traMADol (ULTRAM) 50 MG tablet; please f/u with patient about this medication with PCP before next week;

## 2014-04-10 NOTE — Telephone Encounter (Signed)
Pt requesting refill Tramadol. Please f/u Last given 9/30 #30,no refills

## 2014-04-11 ENCOUNTER — Telehealth: Payer: Self-pay | Admitting: Internal Medicine

## 2014-04-11 NOTE — Telephone Encounter (Signed)
Pt requesting refill Tramadol. Please f/u 

## 2014-04-11 NOTE — Telephone Encounter (Signed)
This is not my patient and he just had a refill a few days ago.

## 2014-04-11 NOTE — Telephone Encounter (Signed)
Pt called again this morning checking to see if his prescription was approved and ready. Please follow up with pt.

## 2014-04-11 NOTE — Telephone Encounter (Signed)
Patient request medication refill for Tramadol 50mg . Please follow up with Patient.

## 2014-04-11 NOTE — Telephone Encounter (Signed)
Pt requesting medication refill Tramadol. Please f/u 

## 2014-04-12 ENCOUNTER — Other Ambulatory Visit: Payer: Self-pay

## 2014-04-12 DIAGNOSIS — M545 Low back pain: Principal | ICD-10-CM

## 2014-04-12 DIAGNOSIS — G8929 Other chronic pain: Secondary | ICD-10-CM

## 2014-04-12 MED ORDER — TRAMADOL HCL 50 MG PO TABS: 50.0000 mg | ORAL_TABLET | Freq: Three times a day (TID) | ORAL | Status: DC | PRN

## 2014-04-12 NOTE — Telephone Encounter (Signed)
Patient called again this morning requesting a refill. Please follow up with patient.

## 2014-04-12 NOTE — Progress Notes (Unsigned)
Patient requesting refill on his tramadol Per Dr Orpah CobbAdvani- to be used as needed if not refer to pain management

## 2014-04-17 ENCOUNTER — Ambulatory Visit: Payer: Self-pay

## 2014-04-17 ENCOUNTER — Telehealth: Payer: Self-pay | Admitting: Internal Medicine

## 2014-04-17 NOTE — Telephone Encounter (Signed)
Pt. Called stating that his traMADol (ULTRAM) 50 MG tablet is not strong enough for his pain and that also gabapentin (NEURONTIN) 300 MG capsule is not helping his back pain. Please f/u with pt.

## 2014-04-18 ENCOUNTER — Telehealth: Payer: Self-pay | Admitting: Emergency Medicine

## 2014-04-18 NOTE — Telephone Encounter (Signed)
Left message on VM that pt needs to schedule appointment with provided Neuro surgeon referral  Also instructed him to schedule f/u appt with provider to re- evaluate pain control

## 2014-04-29 ENCOUNTER — Ambulatory Visit: Payer: Self-pay | Attending: Internal Medicine | Admitting: Internal Medicine

## 2014-04-29 ENCOUNTER — Encounter: Payer: Self-pay | Admitting: Internal Medicine

## 2014-04-29 VITALS — BP 132/77 | HR 87 | Temp 98.1°F | Resp 16 | Wt 248.0 lb

## 2014-04-29 DIAGNOSIS — G8929 Other chronic pain: Secondary | ICD-10-CM | POA: Insufficient documentation

## 2014-04-29 DIAGNOSIS — M545 Low back pain, unspecified: Secondary | ICD-10-CM

## 2014-04-29 DIAGNOSIS — Z720 Tobacco use: Secondary | ICD-10-CM | POA: Insufficient documentation

## 2014-04-29 DIAGNOSIS — E559 Vitamin D deficiency, unspecified: Secondary | ICD-10-CM | POA: Insufficient documentation

## 2014-04-29 DIAGNOSIS — M5126 Other intervertebral disc displacement, lumbar region: Secondary | ICD-10-CM

## 2014-04-29 DIAGNOSIS — Q059 Spina bifida, unspecified: Secondary | ICD-10-CM | POA: Insufficient documentation

## 2014-04-29 DIAGNOSIS — F17200 Nicotine dependence, unspecified, uncomplicated: Secondary | ICD-10-CM

## 2014-04-29 DIAGNOSIS — M9981 Other biomechanical lesions of cervical region: Secondary | ICD-10-CM

## 2014-04-29 DIAGNOSIS — M4802 Spinal stenosis, cervical region: Secondary | ICD-10-CM | POA: Insufficient documentation

## 2014-04-29 DIAGNOSIS — Z23 Encounter for immunization: Secondary | ICD-10-CM | POA: Insufficient documentation

## 2014-04-29 MED ORDER — TRAMADOL HCL 50 MG PO TABS: 50.0000 mg | ORAL_TABLET | Freq: Three times a day (TID) | ORAL | Status: DC | PRN

## 2014-04-29 MED ORDER — GABAPENTIN 400 MG PO CAPS: 400.0000 mg | ORAL_CAPSULE | Freq: Three times a day (TID) | ORAL | Status: DC

## 2014-04-29 NOTE — Progress Notes (Signed)
Patient states has two dislocated discs in his back States his back pain has gotten much worse He now has some loss of feeling to his right hand

## 2014-04-29 NOTE — Progress Notes (Signed)
MRN: [id number] Name: Paul ParkinsonFrederick A Shorts  Sex: male Age: 43 y.o. DOB: [birth year]  Allergies: Review of patient's allergies indicates no known allergies.  Chief Complaint  Patient presents with  . Follow-up    HPI: Patient is 43 y.o. male who has history of cervical neuroforaminal stenosis, patient does complain of difficulty with her left hand grip and numbness tingling in left arm sometimes in the right arm , patient has history of a spina bifida and so had surgery done in the past patient has already been referred to neurosurgery, he was prescribed tramadol and Neurontin patient does report some improvement but would like to increase the dosage , patient had a blood work done which was reviewed with the patient noticed vitamin D deficiency.   Past Medical History  Diagnosis Date  . Anxiety   . Spina bifida   . Depression     Past Surgical History  Procedure Laterality Date  . Back surgery        Medication List       This list is accurate as of: 04/29/14  6:03 PM.  Always use your most recent med list.               ALPRAZolam 0.5 MG tablet  Commonly known as:  XANAX  Take 0.5 mg by mouth at bedtime as needed for anxiety.     gabapentin 400 MG capsule  Commonly known as:  NEURONTIN  Take 1 capsule (400 mg total) by mouth 3 (three) times daily.     HYDROcodone-acetaminophen 5-325 MG per tablet  Commonly known as:  NORCO/VICODIN  Take 1 tablet by mouth every 4 (four) hours as needed for moderate pain or severe pain.     ibuprofen 200 MG tablet  Commonly known as:  ADVIL,MOTRIN  Take 800 mg by mouth every 6 (six) hours as needed for headache or moderate pain.     traMADol 50 MG tablet  Commonly known as:  ULTRAM  Take 1 tablet (50 mg total) by mouth every 8 (eight) hours as needed for moderate pain.     Vitamin D (Ergocalciferol) 50000 UNITS Caps capsule  Commonly known as:  DRISDOL  Take 1 capsule (50,000 Units total) by mouth every 7 (seven) days.        Meds ordered this encounter  Medications  . traMADol (ULTRAM) 50 MG tablet    Sig: Take 1 tablet (50 mg total) by mouth every 8 (eight) hours as needed for moderate pain.    Dispense:  60 tablet    Refill:  0  . gabapentin (NEURONTIN) 400 MG capsule    Sig: Take 1 capsule (400 mg total) by mouth 3 (three) times daily.    Dispense:  90 capsule    Refill:  3    Immunization History  Administered Date(s) Administered  . Influenza,inj,Quad PF,36+ Mos 04/02/2014  . Pneumococcal Polysaccharide-23 04/29/2014    Family History  Problem Relation Age of Onset  . Mental illness Mother     PANIC ATTACKS  . Heart disease Father   . Spina bifida Sister   . Cancer Paternal Grandfather     History  Substance Use Topics  . Smoking status: Current Every Day Smoker -- 1.00 packs/day for 30 years    Types: Cigarettes  . Smokeless tobacco: Never Used  . Alcohol Use: No    Review of Systems   As noted in HPI  Filed Vitals:   04/29/14 1650  BP: 132/77  Pulse:  87  Temp: 98.1 F (36.7 C)  Resp: 16    Physical Exam  Physical Exam  Constitutional: No distress.  Eyes: EOM are normal. Pupils are equal, round, and reactive to light.  Cardiovascular: Normal rate and regular rhythm.   Pulmonary/Chest: Breath sounds normal. No respiratory distress. He has no wheezes. He has no rales.  Musculoskeletal:  Weak hand grip bilaterally, 2+ radial pulse     CBC    Component Value Date/Time   WBC 8.6 04/02/2014 1638   RBC 5.39 04/02/2014 1638   HGB 16.3 04/02/2014 1638   HCT 46.5 04/02/2014 1638   PLT 218 04/02/2014 1638   MCV 86.3 04/02/2014 1638   LYMPHSABS 2.6 04/02/2014 1638   MONOABS 0.4 04/02/2014 1638   EOSABS 0.1 04/02/2014 1638   BASOSABS 0.1 04/02/2014 1638    CMP     Component Value Date/Time   NA 134* 04/02/2014 1638   K 3.5 04/02/2014 1638   CL 104 04/02/2014 1638   CO2 22 04/02/2014 1638   GLUCOSE 94 04/02/2014 1638   BUN 17 04/02/2014 1638   CREATININE 1.14 04/02/2014  1638   CREATININE 0.95 03/13/2014 1218   CALCIUM 9.5 04/02/2014 1638   PROT 7.1 04/02/2014 1638   ALBUMIN 4.5 04/02/2014 1638   AST 15 04/02/2014 1638   ALT 32 04/02/2014 1638   ALKPHOS 73 04/02/2014 1638   BILITOT 0.5 04/02/2014 1638   GFRNONAA 78 04/02/2014 1638   GFRNONAA >90 03/13/2014 1218   GFRAA >89 04/02/2014 1638   GFRAA >90 03/13/2014 1218    No results found for this basename: chol,  tri,  ldl    No components found with this basename: hga1c    Lab Results  Component Value Date/Time   AST 15 04/02/2014  4:38 PM    Assessment and Plan  Neural foraminal stenosis of cervical spine/Protruded lumbar disc Patient has already been referred to neurosurgery, he'll follow with the social worker  Chronic low back pain - Plan: traMADol (ULTRAM) 50 MG tablet, I have increased the dose of Neurontin gabapentin (NEURONTIN) 400 MG capsule  Vitamin D deficiency Prescription is already done.  Tobacco use disorder Again counseled patient to quit smoking, he's not ready yet.  Health Maintenance Pneumovax today   Return in about 3 months (around 07/30/2014).  Doris CheadleADVANI, Mairany Bruno, MD

## 2014-04-29 NOTE — Patient Instructions (Signed)
Smoking Cessation Quitting smoking is important to your health and has many advantages. However, it is not always easy to quit since nicotine is a very addictive drug. Oftentimes, people try 3 times or more before being able to quit. This document explains the best ways for you to prepare to quit smoking. Quitting takes hard work and a lot of effort, but you can do it. ADVANTAGES OF QUITTING SMOKING  You will live longer, feel better, and live better.  Your body will feel the impact of quitting smoking almost immediately.  Within 20 minutes, blood pressure decreases. Your pulse returns to its normal level.  After 8 hours, carbon monoxide levels in the blood return to normal. Your oxygen level increases.  After 24 hours, the chance of having a heart attack starts to decrease. Your breath, hair, and body stop smelling like smoke.  After 48 hours, damaged nerve endings begin to recover. Your sense of taste and smell improve.  After 72 hours, the body is virtually free of nicotine. Your bronchial tubes relax and breathing becomes easier.  After 2 to 12 weeks, lungs can hold more air. Exercise becomes easier and circulation improves.  The risk of having a heart attack, stroke, cancer, or lung disease is greatly reduced.  After 1 year, the risk of coronary heart disease is cut in half.  After 5 years, the risk of stroke falls to the same as a nonsmoker.  After 10 years, the risk of lung cancer is cut in half and the risk of other cancers decreases significantly.  After 15 years, the risk of coronary heart disease drops, usually to the level of a nonsmoker.  If you are pregnant, quitting smoking will improve your chances of having a healthy baby.  The people you live with, especially any children, will be healthier.  You will have extra money to spend on things other than cigarettes. QUESTIONS TO THINK ABOUT BEFORE ATTEMPTING TO QUIT You may want to talk about your answers with your  health care provider.  Why do you want to quit?  If you tried to quit in the past, what helped and what did not?  What will be the most difficult situations for you after you quit? How will you plan to handle them?  Who can help you through the tough times? Your family? Friends? A health care provider?  What pleasures do you get from smoking? What ways can you still get pleasure if you quit? Here are some questions to ask your health care provider:  How can you help me to be successful at quitting?  What medicine do you think would be best for me and how should I take it?  What should I do if I need more help?  What is smoking withdrawal like? How can I get information on withdrawal? GET READY  Set a quit date.  Change your environment by getting rid of all cigarettes, ashtrays, matches, and lighters in your home, car, or work. Do not let people smoke in your home.  Review your past attempts to quit. Think about what worked and what did not. GET SUPPORT AND ENCOURAGEMENT You have a better chance of being successful if you have help. You can get support in many ways.  Tell your family, friends, and coworkers that you are going to quit and need their support. Ask them not to smoke around you.  Get individual, group, or telephone counseling and support. Programs are available at local hospitals and health centers. Call   your local health department for information about programs in your area.  Spiritual beliefs and practices may help some smokers quit.  Download a "quit meter" on your computer to keep track of quit statistics, such as how long you have gone without smoking, cigarettes not smoked, and money saved.  Get a self-help book about quitting smoking and staying off tobacco. LEARN NEW SKILLS AND BEHAVIORS  Distract yourself from urges to smoke. Talk to someone, go for a walk, or occupy your time with a task.  Change your normal routine. Take a different route to work.  Drink tea instead of coffee. Eat breakfast in a different place.  Reduce your stress. Take a hot bath, exercise, or read a book.  Plan something enjoyable to do every day. Reward yourself for not smoking.  Explore interactive web-based programs that specialize in helping you quit. GET MEDICINE AND USE IT CORRECTLY Medicines can help you stop smoking and decrease the urge to smoke. Combining medicine with the above behavioral methods and support can greatly increase your chances of successfully quitting smoking.  Nicotine replacement therapy helps deliver nicotine to your body without the negative effects and risks of smoking. Nicotine replacement therapy includes nicotine gum, lozenges, inhalers, nasal sprays, and skin patches. Some may be available over-the-counter and others require a prescription.  Antidepressant medicine helps people abstain from smoking, but how this works is unknown. This medicine is available by prescription.  Nicotinic receptor partial agonist medicine simulates the effect of nicotine in your brain. This medicine is available by prescription. Ask your health care provider for advice about which medicines to use and how to use them based on your health history. Your health care provider will tell you what side effects to look out for if you choose to be on a medicine or therapy. Carefully read the information on the package. Do not use any other product containing nicotine while using a nicotine replacement product.  RELAPSE OR DIFFICULT SITUATIONS Most relapses occur within the first 3 months after quitting. Do not be discouraged if you start smoking again. Remember, most people try several times before finally quitting. You may have symptoms of withdrawal because your body is used to nicotine. You may crave cigarettes, be irritable, feel very hungry, cough often, get headaches, or have difficulty concentrating. The withdrawal symptoms are only temporary. They are strongest  when you first quit, but they will go away within 10-14 days. To reduce the chances of relapse, try to:  Avoid drinking alcohol. Drinking lowers your chances of successfully quitting.  Reduce the amount of caffeine you consume. Once you quit smoking, the amount of caffeine in your body increases and can give you symptoms, such as a rapid heartbeat, sweating, and anxiety.  Avoid smokers because they can make you want to smoke.  Do not let weight gain distract you. Many smokers will gain weight when they quit, usually less than 10 pounds. Eat a healthy diet and stay active. You can always lose the weight gained after you quit.  Find ways to improve your mood other than smoking. FOR MORE INFORMATION  www.smokefree.gov  Document Released: 06/15/2001 Document Revised: 11/05/2013 Document Reviewed: 09/30/2011 [employer] Patient Information 2015 [employer]. This information is not intended to replace advice given to you by your health care provider. Make sure you discuss any questions you have with your health care provider.  

## 2014-05-07 ENCOUNTER — Ambulatory Visit: Payer: Self-pay

## 2014-05-14 ENCOUNTER — Telehealth: Payer: Self-pay | Admitting: Internal Medicine

## 2014-05-14 NOTE — Telephone Encounter (Signed)
Pt states that the prescribed medication is not alleviating pain and is still having difficulty with feeling and mobility in right arm. Please f/u with pt.

## 2014-05-17 ENCOUNTER — Other Ambulatory Visit (HOSPITAL_COMMUNITY): Payer: Self-pay | Admitting: Neurosurgery

## 2014-05-17 DIAGNOSIS — G959 Disease of spinal cord, unspecified: Secondary | ICD-10-CM

## 2014-05-17 NOTE — Telephone Encounter (Signed)
Expand All Collapse All   Pt states that the prescribed medication is not alleviating pain and is still having difficulty with feeling and mobility in right arm. Please f/u with pt.           Pt is taking Neurontin and Tramadol. Please f.u

## 2014-06-11 ENCOUNTER — Ambulatory Visit: Payer: Self-pay | Attending: Internal Medicine | Admitting: Internal Medicine

## 2014-06-11 ENCOUNTER — Encounter: Payer: Self-pay | Admitting: Internal Medicine

## 2014-06-11 VITALS — BP 127/84 | HR 78 | Temp 98.0°F | Resp 16 | Wt 242.6 lb

## 2014-06-11 DIAGNOSIS — M545 Low back pain, unspecified: Secondary | ICD-10-CM

## 2014-06-11 DIAGNOSIS — M542 Cervicalgia: Secondary | ICD-10-CM

## 2014-06-11 DIAGNOSIS — Q059 Spina bifida, unspecified: Secondary | ICD-10-CM | POA: Insufficient documentation

## 2014-06-11 DIAGNOSIS — G8929 Other chronic pain: Secondary | ICD-10-CM

## 2014-06-11 DIAGNOSIS — F17210 Nicotine dependence, cigarettes, uncomplicated: Secondary | ICD-10-CM | POA: Insufficient documentation

## 2014-06-11 DIAGNOSIS — F419 Anxiety disorder, unspecified: Secondary | ICD-10-CM | POA: Insufficient documentation

## 2014-06-11 MED ORDER — ACETAMINOPHEN-CODEINE #3 300-30 MG PO TABS: 1.0000 | ORAL_TABLET | Freq: Three times a day (TID) | ORAL | Status: DC | PRN

## 2014-06-11 MED ORDER — GABAPENTIN 400 MG PO CAPS: 400.0000 mg | ORAL_CAPSULE | Freq: Three times a day (TID) | ORAL | Status: DC

## 2014-06-11 NOTE — Progress Notes (Signed)
MRN: [id number] Name: Paul Baldwin  Sex: male Age: 43 y.o. DOB: March 23, 1971  Allergies: Review of patient's allergies indicates no known allergies.  Chief Complaint  Patient presents with  . Follow-up    HPI: Patient is 43 y.o. male who history of chronic neck pain, chronic lower back pain as per patient he was seen by neurosurgeon last month and was advised to get a C-spine MRI done which as per patient does not know when he is a scheduled, patient was prescribed Neurontin as well as tramadol last  time he is requesting different medication for the pain.  Past Medical History  Diagnosis Date  . Anxiety   . Spina bifida   . Depression     Past Surgical History  Procedure Laterality Date  . Back surgery        Medication List       This list is accurate as of: 06/11/14 12:42 PM.  Always use your most recent med list.               acetaminophen-codeine 300-30 MG per tablet  Commonly known as:  TYLENOL #3  Take 1 tablet by mouth every 8 (eight) hours as needed for moderate pain.     ALPRAZolam 0.5 MG tablet  Commonly known as:  XANAX  Take 0.5 mg by mouth at bedtime as needed for anxiety.     gabapentin 400 MG capsule  Commonly known as:  NEURONTIN  Take 1 capsule (400 mg total) by mouth 3 (three) times daily.     HYDROcodone-acetaminophen 5-325 MG per tablet  Commonly known as:  NORCO/VICODIN  Take 1 tablet by mouth every 4 (four) hours as needed for moderate pain or severe pain.     ibuprofen 200 MG tablet  Commonly known as:  ADVIL,MOTRIN  Take 800 mg by mouth every 6 (six) hours as needed for headache or moderate pain.     traMADol 50 MG tablet  Commonly known as:  ULTRAM  Take 1 tablet (50 mg total) by mouth every 8 (eight) hours as needed for moderate pain.     Vitamin D (Ergocalciferol) 50000 UNITS Caps capsule  Commonly known as:  DRISDOL  Take 1 capsule (50,000 Units total) by mouth every 7 (seven) days.        Meds ordered this  encounter  Medications  . acetaminophen-codeine (TYLENOL #3) 300-30 MG per tablet    Sig: Take 1 tablet by mouth every 8 (eight) hours as needed for moderate pain.    Dispense:  60 tablet    Refill:  0  . gabapentin (NEURONTIN) 400 MG capsule    Sig: Take 1 capsule (400 mg total) by mouth 3 (three) times daily.    Dispense:  90 capsule    Refill:  3    Immunization History  Administered Date(s) Administered  . Influenza,inj,Quad PF,36+ Mos 04/02/2014  . Pneumococcal Polysaccharide-23 04/29/2014    Family History  Problem Relation Age of Onset  . Mental illness Mother     PANIC ATTACKS  . Heart disease Father   . Spina bifida Sister   . Cancer Paternal Grandfather     History  Substance Use Topics  . Smoking status: Current Every Day Smoker -- 1.00 packs/day for 30 years    Types: Cigarettes  . Smokeless tobacco: Never Used  . Alcohol Use: No    Review of Systems   As noted in HPI  Filed Vitals:   06/11/14 1227  BP:  127/84  Pulse: 78  Temp: 98 F (36.7 C)  Resp: 16    Physical Exam  Physical Exam  Constitutional: No distress.  Eyes: EOM are normal. Pupils are equal, round, and reactive to light.  Cardiovascular: Normal rate and regular rhythm.   Pulmonary/Chest: Breath sounds normal. No respiratory distress. He has no wheezes. He has no rales.  Musculoskeletal:  Weak hand grip bilaterally, 2+ radial pulse    CBC    Component Value Date/Time   WBC 8.6 04/02/2014 1638   RBC 5.39 04/02/2014 1638   HGB 16.3 04/02/2014 1638   HCT 46.5 04/02/2014 1638   PLT 218 04/02/2014 1638   MCV 86.3 04/02/2014 1638   LYMPHSABS 2.6 04/02/2014 1638   MONOABS 0.4 04/02/2014 1638   EOSABS 0.1 04/02/2014 1638   BASOSABS 0.1 04/02/2014 1638    CMP     Component Value Date/Time   NA 134* 04/02/2014 1638   K 3.5 04/02/2014 1638   CL 104 04/02/2014 1638   CO2 22 04/02/2014 1638   GLUCOSE 94 04/02/2014 1638   BUN 17 04/02/2014 1638   CREATININE 1.14 04/02/2014  1638   CREATININE 0.95 03/13/2014 1218   CALCIUM 9.5 04/02/2014 1638   PROT 7.1 04/02/2014 1638   ALBUMIN 4.5 04/02/2014 1638   AST 15 04/02/2014 1638   ALT 32 04/02/2014 1638   ALKPHOS 73 04/02/2014 1638   BILITOT 0.5 04/02/2014 1638   GFRNONAA 78 04/02/2014 1638   GFRNONAA >90 03/13/2014 1218   GFRAA >89 04/02/2014 1638   GFRAA >90 03/13/2014 1218    No results found for: CHOL  No components found for: HGA1C  Lab Results  Component Value Date/Time   AST 15 04/02/2014 04:38 PM    Assessment and Plan  Cervicalgia - Plan:patient will check with neurosurgeon and he is scheduled for MRI, I prescribed acetaminophen-codeine (TYLENOL #3) 300-30 MG per tablet also he will continue with Neurontin  Chronic low back pain - Plan: acetaminophen-codeine (TYLENOL #3) 300-30 MG per tablet, gabapentin (NEURONTIN) 400 MG capsule  Follow up as scheduled.  Doris CheadleADVANI, Alliya Marcon, MD

## 2014-06-11 NOTE — Progress Notes (Signed)
Patient here for follow up Complains of still having back neck and left hand pain Saw the neuro surgeon two weeks ago and feels he didn't do any thing for him Is supposed to  Be putting additional orders in for more MRI's

## 2014-06-19 ENCOUNTER — Telehealth: Payer: Self-pay | Admitting: Internal Medicine

## 2014-06-19 NOTE — Telephone Encounter (Signed)
Pt following up on refill request for traMADol (ULTRAM) 50 MG tablet, says he left voicemail message on nurse's line. Please f/u with pt, pt says it is urgent because he has been out of med.

## 2014-06-19 NOTE — Telephone Encounter (Signed)
Patient has called in to request a medication refill for traMADol (ULTRAM) 50 MG tablet; patient had some negative side effects to the Tylenol #3 and would like to just continue the traMADol (ULTRAM) 50 MG tablet; please f/u with patient

## 2014-06-21 ENCOUNTER — Telehealth: Payer: Self-pay | Admitting: *Deleted

## 2014-06-21 NOTE — Telephone Encounter (Signed)
Patient called stating he would like to continue on Tramadol patient had some negative side effects to the Tylenol #3 and would like to just continue the traMADol (ULTRAM) 50 MG tablet; please f/u with patient. Annamaria Hellingose,Afton Mikelson Renee, RN

## 2014-06-21 NOTE — Telephone Encounter (Signed)
Patient can  be given refill on tramadol.

## 2014-06-24 ENCOUNTER — Other Ambulatory Visit: Payer: Self-pay

## 2014-06-24 ENCOUNTER — Telehealth: Payer: Self-pay

## 2014-06-24 DIAGNOSIS — M545 Low back pain, unspecified: Secondary | ICD-10-CM

## 2014-06-24 DIAGNOSIS — G8929 Other chronic pain: Secondary | ICD-10-CM

## 2014-06-24 MED ORDER — TRAMADOL HCL 50 MG PO TABS: 50.0000 mg | ORAL_TABLET | Freq: Three times a day (TID) | ORAL | Status: DC | PRN

## 2014-06-24 NOTE — Telephone Encounter (Signed)
Pt calling to follow up on medication refill. Please f/u with pt.  °

## 2014-06-24 NOTE — Telephone Encounter (Signed)
Patient had called requesting a refill on his tramadol Per Dr Orpah CobbAdvani it is ok to refill at this time Prescription printed and is at the front desk for patient

## 2014-07-18 ENCOUNTER — Telehealth: Payer: Self-pay | Admitting: Internal Medicine

## 2014-07-18 NOTE — Telephone Encounter (Signed)
Patient called to request a medication refill traMADol (ULTRAM) 50 MG tablet, please f/u with pt.

## 2014-07-25 ENCOUNTER — Telehealth: Payer: Self-pay | Admitting: Internal Medicine

## 2014-07-25 DIAGNOSIS — M545 Low back pain, unspecified: Secondary | ICD-10-CM

## 2014-07-25 DIAGNOSIS — G8929 Other chronic pain: Secondary | ICD-10-CM

## 2014-07-25 NOTE — Telephone Encounter (Signed)
Patient has called to request a medication refill for traMADol (ULTRAM) 50 MG tablet; please f/u with patient

## 2014-07-29 MED ORDER — TRAMADOL HCL 50 MG PO TABS: 50.0000 mg | ORAL_TABLET | Freq: Three times a day (TID) | ORAL | Status: DC | PRN

## 2014-07-29 NOTE — Addendum Note (Signed)
Addended by: Dyann KiefGIRALDEZ, Linh Hedberg M on: 07/29/2014 01:14 PM   Modules accepted: Orders, Medications

## 2014-07-29 NOTE — Telephone Encounter (Signed)
Rx refill. Pt aware is ready to be pick up

## 2014-07-29 NOTE — Telephone Encounter (Signed)
Patient can be given refill on the medication. 

## 2014-08-15 ENCOUNTER — Other Ambulatory Visit: Payer: Self-pay | Admitting: Internal Medicine

## 2014-08-16 ENCOUNTER — Ambulatory Visit: Payer: Self-pay | Admitting: Internal Medicine

## 2014-08-20 ENCOUNTER — Telehealth: Payer: Self-pay | Admitting: Internal Medicine

## 2014-08-20 NOTE — Telephone Encounter (Signed)
Patient called to request a med refill for Tramadol, please f/u with pt. °

## 2014-08-20 NOTE — Telephone Encounter (Signed)
Please f/u with patient about med refill.

## 2014-08-22 ENCOUNTER — Ambulatory Visit: Payer: Self-pay | Attending: Internal Medicine | Admitting: Internal Medicine

## 2014-08-22 ENCOUNTER — Encounter: Payer: Self-pay | Admitting: Internal Medicine

## 2014-08-22 VITALS — BP 125/86 | HR 92 | Temp 98.0°F | Resp 16 | Wt 241.4 lb

## 2014-08-22 DIAGNOSIS — M545 Low back pain, unspecified: Secondary | ICD-10-CM

## 2014-08-22 DIAGNOSIS — F329 Major depressive disorder, single episode, unspecified: Secondary | ICD-10-CM

## 2014-08-22 DIAGNOSIS — F32A Depression, unspecified: Secondary | ICD-10-CM

## 2014-08-22 DIAGNOSIS — Z720 Tobacco use: Secondary | ICD-10-CM

## 2014-08-22 DIAGNOSIS — G8929 Other chronic pain: Secondary | ICD-10-CM

## 2014-08-22 DIAGNOSIS — F17200 Nicotine dependence, unspecified, uncomplicated: Secondary | ICD-10-CM

## 2014-08-22 DIAGNOSIS — G4700 Insomnia, unspecified: Secondary | ICD-10-CM | POA: Insufficient documentation

## 2014-08-22 MED ORDER — MIRTAZAPINE 7.5 MG PO TABS: 7.5000 mg | ORAL_TABLET | Freq: Every day | ORAL | Status: DC

## 2014-08-22 MED ORDER — GABAPENTIN 400 MG PO CAPS: 400.0000 mg | ORAL_CAPSULE | Freq: Four times a day (QID) | ORAL | Status: DC

## 2014-08-22 MED ORDER — TRAMADOL HCL 50 MG PO TABS: 50.0000 mg | ORAL_TABLET | Freq: Three times a day (TID) | ORAL | Status: DC | PRN

## 2014-08-22 NOTE — Progress Notes (Signed)
MRN: [id number] Name: Paul ParkinsonFrederick A Leist  Sex: male Age: 44 y.o. DOB: 1970/11/26  Allergies: Review of patient's allergies indicates no known allergies.  Chief Complaint  Patient presents with  . Follow-up    HPI: Patient is 44 y.o. male who male who  history of chronic neck pain, chronic lower back pain as per patient he is waiting on insurance and then he'll be able to see his neurosurgeon today is requesting refill on his medications patient is also complaining of anxiety/depression and problem with the sleep as per patient he used Xanax in the past.  Past Medical History  Diagnosis Date  . Anxiety   . Spina bifida   . Depression     Past Surgical History  Procedure Laterality Date  . Back surgery        Medication List       This list is accurate as of: 08/22/14 10:02 AM.  Always use your most recent med list.               acetaminophen-codeine 300-30 MG per tablet  Commonly known as:  TYLENOL #3  Take 1 tablet by mouth every 8 (eight) hours as needed for moderate pain.     ALPRAZolam 0.5 MG tablet  Commonly known as:  XANAX  Take 0.5 mg by mouth at bedtime as needed for anxiety.     gabapentin 400 MG capsule  Commonly known as:  NEURONTIN  Take 1 capsule (400 mg total) by mouth 4 (four) times daily.     HYDROcodone-acetaminophen 5-325 MG per tablet  Commonly known as:  NORCO/VICODIN  Take 1 tablet by mouth every 4 (four) hours as needed for moderate pain or severe pain.     ibuprofen 200 MG tablet  Commonly known as:  ADVIL,MOTRIN  Take 800 mg by mouth every 6 (six) hours as needed for headache or moderate pain.     mirtazapine 7.5 MG tablet  Commonly known as:  REMERON  Take 1 tablet (7.5 mg total) by mouth at bedtime.     traMADol 50 MG tablet  Commonly known as:  ULTRAM  Take 1 tablet (50 mg total) by mouth every 8 (eight) hours as needed for moderate pain.     Vitamin D (Ergocalciferol) 50000 UNITS Caps capsule  Commonly known as:   DRISDOL  Take 1 capsule (50,000 Units total) by mouth every 7 (seven) days.        Meds ordered this encounter  Medications  . gabapentin (NEURONTIN) 400 MG capsule    Sig: Take 1 capsule (400 mg total) by mouth 4 (four) times daily.    Dispense:  120 capsule    Refill:  3  . traMADol (ULTRAM) 50 MG tablet    Sig: Take 1 tablet (50 mg total) by mouth every 8 (eight) hours as needed for moderate pain.    Dispense:  60 tablet    Refill:  0  . mirtazapine (REMERON) 7.5 MG tablet    Sig: Take 1 tablet (7.5 mg total) by mouth at bedtime.    Dispense:  30 tablet    Refill:  3    Immunization History  Administered Date(s) Administered  . Influenza,inj,Quad PF,36+ Mos 04/02/2014  . Pneumococcal Polysaccharide-23 04/29/2014    Family History  Problem Relation Age of Onset  . Mental illness Mother     PANIC ATTACKS  . Heart disease Father   . Spina bifida Sister   . Cancer Paternal Grandfather  History  Substance Use Topics  . Smoking status: Current Every Day Smoker -- 1.00 packs/day for 30 years    Types: Cigarettes  . Smokeless tobacco: Never Used  . Alcohol Use: No    Review of Systems   As noted in HPI  Filed Vitals:   08/22/14 0933  BP: 125/86  Pulse: 92  Temp: 98 F (36.7 C)  Resp: 16    Physical Exam  Physical Exam  Eyes: EOM are normal. Pupils are equal, round, and reactive to light.  Cardiovascular: Normal rate.   Pulmonary/Chest: Breath sounds normal. No respiratory distress. He has no wheezes. He has no rales.  Musculoskeletal: He exhibits no edema.  Lower lumbar paraspinal tenderness     CBC    Component Value Date/Time   WBC 8.6 04/02/2014 1638   RBC 5.39 04/02/2014 1638   HGB 16.3 04/02/2014 1638   HCT 46.5 04/02/2014 1638   PLT 218 04/02/2014 1638   MCV 86.3 04/02/2014 1638   LYMPHSABS 2.6 04/02/2014 1638   MONOABS 0.4 04/02/2014 1638   EOSABS 0.1 04/02/2014 1638   BASOSABS 0.1 04/02/2014 1638    CMP     Component Value  Date/Time   NA 134* 04/02/2014 1638   K 3.5 04/02/2014 1638   CL 104 04/02/2014 1638   CO2 22 04/02/2014 1638   GLUCOSE 94 04/02/2014 1638   BUN 17 04/02/2014 1638   CREATININE 1.14 04/02/2014 1638   CREATININE 0.95 03/13/2014 1218   CALCIUM 9.5 04/02/2014 1638   PROT 7.1 04/02/2014 1638   ALBUMIN 4.5 04/02/2014 1638   AST 15 04/02/2014 1638   ALT 32 04/02/2014 1638   ALKPHOS 73 04/02/2014 1638   BILITOT 0.5 04/02/2014 1638   GFRNONAA 78 04/02/2014 1638   GFRNONAA >90 03/13/2014 1218   GFRAA >89 04/02/2014 1638   GFRAA >90 03/13/2014 1218    No results found for: CHOL  No components found for: HGA1C  Lab Results  Component Value Date/Time   AST 15 04/02/2014 04:38 PM    Assessment and Plan  Chronic low back pain - Plan: I have increased the dose of Neurontin gabapentin (NEURONTIN) 400 MG capsule, traMADol (ULTRAM) 50 MG tablet  Tobacco use disorder Advise patient to quit smoking.  Depression/Insomnia  - Plan: trial of mirtazapine (REMERON) 7.5 MG tablet    Health Maintenance : -Vaccinations:  uptodate with flu shot and pneumovax   Return in about 3 months (around 11/20/2014), or if symptoms worsen or fail to improve.   This note has been created with Education officer, environmental. Any transcriptional errors are unintentional.    Doris Cheadle, MD

## 2014-08-22 NOTE — Progress Notes (Signed)
Patient here for follow up on his chronic back pain Use of his left arm has gotten worse Requesting refills on his medications

## 2014-09-09 ENCOUNTER — Telehealth: Payer: Self-pay | Admitting: Internal Medicine

## 2014-09-09 NOTE — Telephone Encounter (Signed)
Patient called to request a med refill for traMADol (ULTRAM) 50 MG tablet. Please f/u with pt. °

## 2014-09-09 NOTE — Telephone Encounter (Signed)
Expand All Collapse All   Patient called to request a med refill for traMADol (ULTRAM) 50 MG tablet. Please f/u with pt.             Pt last given 2/18/ #60

## 2014-09-13 ENCOUNTER — Telehealth: Payer: Self-pay | Admitting: Internal Medicine

## 2014-09-13 NOTE — Telephone Encounter (Signed)
Patient called to check on the status of his medication refill for Tramadol, please f/u with pt.

## 2014-09-17 ENCOUNTER — Telehealth: Payer: Self-pay

## 2014-09-17 NOTE — Telephone Encounter (Signed)
Patient is requesting a refill on his tramadol Can we refill this?

## 2014-09-20 ENCOUNTER — Telehealth: Payer: Self-pay | Admitting: Internal Medicine

## 2014-09-20 NOTE — Telephone Encounter (Signed)
Patient can be given refill on tramadol, if the last prescription was 30 days ago.

## 2014-09-20 NOTE — Telephone Encounter (Signed)
Pt called requesting medication refill oTakingNot TakingUnknown ALPRAZolam (XANAX) 0.25 MG tablet n

## 2014-09-23 ENCOUNTER — Telehealth: Payer: Self-pay

## 2014-09-23 ENCOUNTER — Other Ambulatory Visit: Payer: Self-pay

## 2014-09-23 DIAGNOSIS — G8929 Other chronic pain: Secondary | ICD-10-CM

## 2014-09-23 DIAGNOSIS — M545 Low back pain: Principal | ICD-10-CM

## 2014-09-23 MED ORDER — TRAMADOL HCL 50 MG PO TABS: 50.0000 mg | ORAL_TABLET | Freq: Three times a day (TID) | ORAL | Status: DC | PRN

## 2014-09-23 NOTE — Telephone Encounter (Signed)
Patient called for refill on his tramadol Prescription printed and is at the front to be picked up

## 2014-09-23 NOTE — Telephone Encounter (Signed)
Pt. Is calling regarding medication refill...please follow up with patient

## 2014-10-11 ENCOUNTER — Telehealth: Payer: Self-pay | Admitting: Internal Medicine

## 2014-10-11 NOTE — Telephone Encounter (Signed)
Pt requesting refill on Tramadol, please f/u with pt.  °

## 2014-10-14 NOTE — Telephone Encounter (Signed)
Pt calling to follow up on med refill request for Tramadol, please f/u with pt.

## 2014-10-15 ENCOUNTER — Telehealth: Payer: Self-pay | Admitting: Internal Medicine

## 2014-10-15 ENCOUNTER — Other Ambulatory Visit: Payer: Self-pay

## 2014-10-15 ENCOUNTER — Telehealth: Payer: Self-pay

## 2014-10-15 DIAGNOSIS — M5489 Other dorsalgia: Secondary | ICD-10-CM

## 2014-10-15 NOTE — Telephone Encounter (Signed)
Patient called requesting a refill on his tramadol Patient is taking three daily Explained that it is too soon to refill Per Dr Orpah CobbAdvani will refer to pain management

## 2014-10-15 NOTE — Telephone Encounter (Signed)
Patient called to request a med refill for Tramadol, please f/u with pt. °

## 2014-10-22 ENCOUNTER — Ambulatory Visit: Payer: Self-pay | Admitting: Internal Medicine

## 2014-10-22 NOTE — Telephone Encounter (Signed)
Patient is calling to speak to nurse in regards to his med refill for Tramadol, please f/u with pt.

## 2014-10-23 ENCOUNTER — Telehealth: Payer: Self-pay | Admitting: Internal Medicine

## 2014-10-23 NOTE — Telephone Encounter (Signed)
Patient called to request a med refill for Tramadol, please f/u °

## 2014-10-24 ENCOUNTER — Telehealth: Payer: Self-pay

## 2014-10-24 DIAGNOSIS — G8929 Other chronic pain: Secondary | ICD-10-CM

## 2014-10-24 DIAGNOSIS — M545 Low back pain: Principal | ICD-10-CM

## 2014-10-24 MED ORDER — TRAMADOL HCL 50 MG PO TABS: 50.0000 mg | ORAL_TABLET | Freq: Three times a day (TID) | ORAL | Status: DC | PRN

## 2014-10-24 NOTE — Telephone Encounter (Signed)
Pt calling to f/u on med refill request.

## 2014-10-24 NOTE — Telephone Encounter (Signed)
Patient called requesting a refill on her tramadol Prescription printed and is at the front desk

## 2014-10-25 NOTE — Telephone Encounter (Signed)
Patient's tramadol was filled yesterday and patient was notified

## 2014-11-19 ENCOUNTER — Telehealth: Payer: Self-pay | Admitting: Internal Medicine

## 2014-11-19 NOTE — Telephone Encounter (Signed)
Patient is calling to request a med refill for traMADol (ULTRAM) 50 MG tablet . Please f/u with pt.

## 2014-11-20 ENCOUNTER — Telehealth: Payer: Self-pay | Admitting: Internal Medicine

## 2014-11-20 NOTE — Telephone Encounter (Signed)
Patient called to request a med refill for Tramadol, please f/u with pt. °

## 2014-11-22 ENCOUNTER — Telehealth: Payer: Self-pay

## 2014-11-22 ENCOUNTER — Other Ambulatory Visit: Payer: Self-pay

## 2014-11-22 DIAGNOSIS — G8929 Other chronic pain: Secondary | ICD-10-CM

## 2014-11-22 DIAGNOSIS — M545 Low back pain: Principal | ICD-10-CM

## 2014-11-22 MED ORDER — TRAMADOL HCL 50 MG PO TABS: 50.0000 mg | ORAL_TABLET | Freq: Three times a day (TID) | ORAL | Status: DC | PRN

## 2014-11-22 NOTE — Telephone Encounter (Signed)
Patient called requesting a refill on his tramadol Refill granted but patient needs to schedule and appointment to be seen

## 2014-11-22 NOTE — Telephone Encounter (Signed)
Patient called checking the status of medication refill for traMADol (ULTRAM) 50 MG tablet. Please f/u with patient.

## 2014-11-28 ENCOUNTER — Encounter: Payer: Self-pay | Admitting: Internal Medicine

## 2014-11-28 ENCOUNTER — Ambulatory Visit: Payer: Self-pay | Attending: Internal Medicine | Admitting: Internal Medicine

## 2014-11-28 VITALS — BP 120/88 | HR 104 | Temp 98.0°F | Resp 16 | Wt 246.6 lb

## 2014-11-28 DIAGNOSIS — F17200 Nicotine dependence, unspecified, uncomplicated: Secondary | ICD-10-CM

## 2014-11-28 DIAGNOSIS — F419 Anxiety disorder, unspecified: Secondary | ICD-10-CM | POA: Insufficient documentation

## 2014-11-28 DIAGNOSIS — F17210 Nicotine dependence, cigarettes, uncomplicated: Secondary | ICD-10-CM | POA: Insufficient documentation

## 2014-11-28 DIAGNOSIS — Z720 Tobacco use: Secondary | ICD-10-CM

## 2014-11-28 DIAGNOSIS — M545 Low back pain: Secondary | ICD-10-CM | POA: Insufficient documentation

## 2014-11-28 DIAGNOSIS — E559 Vitamin D deficiency, unspecified: Secondary | ICD-10-CM | POA: Insufficient documentation

## 2014-11-28 DIAGNOSIS — G8929 Other chronic pain: Secondary | ICD-10-CM | POA: Insufficient documentation

## 2014-11-28 DIAGNOSIS — Q059 Spina bifida, unspecified: Secondary | ICD-10-CM | POA: Insufficient documentation

## 2014-11-28 DIAGNOSIS — G4700 Insomnia, unspecified: Secondary | ICD-10-CM | POA: Insufficient documentation

## 2014-11-28 DIAGNOSIS — F32A Depression, unspecified: Secondary | ICD-10-CM

## 2014-11-28 DIAGNOSIS — F329 Major depressive disorder, single episode, unspecified: Secondary | ICD-10-CM | POA: Insufficient documentation

## 2014-11-28 MED ORDER — MIRTAZAPINE 15 MG PO TABS: 15.0000 mg | ORAL_TABLET | Freq: Every day | ORAL | Status: DC

## 2014-11-28 MED ORDER — DULOXETINE HCL 30 MG PO CPEP: 30.0000 mg | ORAL_CAPSULE | Freq: Every day | ORAL | Status: DC

## 2014-11-28 NOTE — Progress Notes (Signed)
MRN: [id number] Name: Paul Baldwin  Sex: male Age: 44 y.o. DOB: 1970-09-23  Allergies: Review of patient's allergies indicates no known allergies.  Chief Complaint  Patient presents with  . Follow-up    HPI: Patient is 44 y.o. male who has history of chronic low back pain neck pain, depression, insomnia, as per patient the pain medication or not helping much he's currently on Neurontin, tramadol, on the last visit he was started on Remeron help her with her depression and sleep symptoms, patient was referred to pain management in the past.  Past Medical History  Diagnosis Date  . Anxiety   . Spina bifida   . Depression     Past Surgical History  Procedure Laterality Date  . Back surgery        Medication List       This list is accurate as of: 11/28/14  1:04 PM.  Always use your most recent med list.               acetaminophen-codeine 300-30 MG per tablet  Commonly known as:  TYLENOL #3  Take 1 tablet by mouth every 8 (eight) hours as needed for moderate pain.     ALPRAZolam 0.5 MG tablet  Commonly known as:  XANAX  Take 0.5 mg by mouth at bedtime as needed for anxiety.     DULoxetine 30 MG capsule  Commonly known as:  CYMBALTA  Take 1 capsule (30 mg total) by mouth daily.     gabapentin 400 MG capsule  Commonly known as:  NEURONTIN  Take 1 capsule (400 mg total) by mouth 4 (four) times daily.     HYDROcodone-acetaminophen 5-325 MG per tablet  Commonly known as:  NORCO/VICODIN  Take 1 tablet by mouth every 4 (four) hours as needed for moderate pain or severe pain.     ibuprofen 200 MG tablet  Commonly known as:  ADVIL,MOTRIN  Take 800 mg by mouth every 6 (six) hours as needed for headache or moderate pain.     mirtazapine 15 MG tablet  Commonly known as:  REMERON  Take 1 tablet (15 mg total) by mouth at bedtime.     Vitamin D (Ergocalciferol) 50000 UNITS Caps capsule  Commonly known as:  DRISDOL  Take 1 capsule (50,000 Units total) by  mouth every 7 (seven) days.        Meds ordered this encounter  Medications  . DULoxetine (CYMBALTA) 30 MG capsule    Sig: Take 1 capsule (30 mg total) by mouth daily.    Dispense:  30 capsule    Refill:  3  . mirtazapine (REMERON) 15 MG tablet    Sig: Take 1 tablet (15 mg total) by mouth at bedtime.    Dispense:  30 tablet    Refill:  3    Immunization History  Administered Date(s) Administered  . Influenza,inj,Quad PF,36+ Mos 04/02/2014  . Pneumococcal Polysaccharide-23 04/29/2014    Family History  Problem Relation Age of Onset  . Mental illness Mother     PANIC ATTACKS  . Heart disease Father   . Spina bifida Sister   . Cancer Paternal Grandfather     History  Substance Use Topics  . Smoking status: Current Every Day Smoker -- 1.00 packs/day for 30 years    Types: Cigarettes  . Smokeless tobacco: Never Used  . Alcohol Use: No    Review of Systems   As noted in HPI  Filed Vitals:   11/28/14 1145  BP: 120/88  Pulse: 104  Temp: 98 F (36.7 C)  Resp: 16    Physical Exam  Physical Exam  Constitutional: No distress.  Eyes: EOM are normal. Pupils are equal, round, and reactive to light.  Cardiovascular: Normal rate and regular rhythm.   Pulmonary/Chest: Breath sounds normal. No respiratory distress. He has no wheezes. He has no rales.    CBC    Component Value Date/Time   WBC 8.6 04/02/2014 1638   RBC 5.39 04/02/2014 1638   HGB 16.3 04/02/2014 1638   HCT 46.5 04/02/2014 1638   PLT 218 04/02/2014 1638   MCV 86.3 04/02/2014 1638   LYMPHSABS 2.6 04/02/2014 1638   MONOABS 0.4 04/02/2014 1638   EOSABS 0.1 04/02/2014 1638   BASOSABS 0.1 04/02/2014 1638    CMP     Component Value Date/Time   NA 134* 04/02/2014 1638   K 3.5 04/02/2014 1638   CL 104 04/02/2014 1638   CO2 22 04/02/2014 1638   GLUCOSE 94 04/02/2014 1638   BUN 17 04/02/2014 1638   CREATININE 1.14 04/02/2014 1638   CREATININE 0.95 03/13/2014 1218   CALCIUM 9.5 04/02/2014 1638     PROT 7.1 04/02/2014 1638   ALBUMIN 4.5 04/02/2014 1638   AST 15 04/02/2014 1638   ALT 32 04/02/2014 1638   ALKPHOS 73 04/02/2014 1638   BILITOT 0.5 04/02/2014 1638   GFRNONAA 78 04/02/2014 1638   GFRNONAA >90 03/13/2014 1218   GFRAA >89 04/02/2014 1638   GFRAA >90 03/13/2014 1218    No results found for: CHOL  No results found for: HGBA1C  Lab Results  Component Value Date/Time   AST 15 04/02/2014 04:38 PM    Assessment and Plan  Chronic low back pain - Plan: I have discontinued tramadol,continue Neurontin, started patient on DULoxetine (CYMBALTA) 30 MG capsule, Ambulatory referral to Pain Clinic  Depression - Plan: DULoxetine (CYMBALTA) 30 MG capsule, also increased the dose of mirtazapine (REMERON) 15 MG tablet  Insomnia - Plan: mirtazapine (REMERON) 15 MG tablet  Tobacco use disorder Again counseled patient to quit smoking  Vitamin D deficiency Advise patient to start taking over-the-counter vitamin D 2000 units daily    Return in about 3 months (around 02/28/2015), or if symptoms worsen or fail to improve.   This note has been created with Education officer, environmentalDragon speech recognition software and smart phrase technology. Any transcriptional errors are unintentional.    Doris CheadleADVANI, Nathalee Smarr, MD

## 2014-11-28 NOTE — Progress Notes (Signed)
Patient states he is here for a follow up on his chronic back pain Patient is currently taking gabapentin and tramadol and states it is not  Helping him. Patient is requesting a change to these medications Patient would also like a prescription for a sleep aid as he sometimes Goes three to four days with no sleep

## 2014-12-10 ENCOUNTER — Encounter: Payer: Self-pay | Admitting: Physical Medicine & Rehabilitation

## 2014-12-18 ENCOUNTER — Telehealth: Payer: Self-pay | Admitting: Internal Medicine

## 2014-12-18 NOTE — Telephone Encounter (Signed)
Pt has been using medication prescribed during last visit but says it is not working, would like to speak to nurse about request to be put back on Tramadol.  Please f/u with pt.

## 2014-12-23 ENCOUNTER — Encounter: Payer: Self-pay | Admitting: Internal Medicine

## 2014-12-23 ENCOUNTER — Ambulatory Visit: Payer: Self-pay | Attending: Internal Medicine | Admitting: Internal Medicine

## 2014-12-23 VITALS — BP 125/85 | HR 96 | Temp 98.0°F | Resp 16 | Wt 244.0 lb

## 2014-12-23 DIAGNOSIS — Q059 Spina bifida, unspecified: Secondary | ICD-10-CM | POA: Insufficient documentation

## 2014-12-23 DIAGNOSIS — Z720 Tobacco use: Secondary | ICD-10-CM

## 2014-12-23 DIAGNOSIS — F17200 Nicotine dependence, unspecified, uncomplicated: Secondary | ICD-10-CM

## 2014-12-23 DIAGNOSIS — G8929 Other chronic pain: Secondary | ICD-10-CM | POA: Insufficient documentation

## 2014-12-23 DIAGNOSIS — M545 Low back pain, unspecified: Secondary | ICD-10-CM

## 2014-12-23 DIAGNOSIS — F17210 Nicotine dependence, cigarettes, uncomplicated: Secondary | ICD-10-CM | POA: Insufficient documentation

## 2014-12-23 DIAGNOSIS — E559 Vitamin D deficiency, unspecified: Secondary | ICD-10-CM | POA: Insufficient documentation

## 2014-12-23 DIAGNOSIS — F329 Major depressive disorder, single episode, unspecified: Secondary | ICD-10-CM | POA: Insufficient documentation

## 2014-12-23 DIAGNOSIS — G4700 Insomnia, unspecified: Secondary | ICD-10-CM | POA: Insufficient documentation

## 2014-12-23 DIAGNOSIS — F419 Anxiety disorder, unspecified: Secondary | ICD-10-CM | POA: Insufficient documentation

## 2014-12-23 MED ORDER — GABAPENTIN 400 MG PO CAPS: 400.0000 mg | ORAL_CAPSULE | Freq: Four times a day (QID) | ORAL | Status: DC

## 2014-12-23 MED ORDER — TRAMADOL HCL 50 MG PO TABS: 50.0000 mg | ORAL_TABLET | Freq: Three times a day (TID) | ORAL | Status: DC | PRN

## 2014-12-23 NOTE — Progress Notes (Signed)
Patient here for follow up on his chronic back pain Patient also requesting tramadol instead of the medication he was prescribed On his last visit

## 2014-12-23 NOTE — Progress Notes (Signed)
MRN: [id number] Name: Paul Baldwin  Sex: male Age: 44 y.o. DOB: 1971-06-23  Allergies: Review of patient's allergies indicates no known allergies.  Chief Complaint  Patient presents with  . Follow-up    HPI: Patient is 44 y.o. male who has history of chronic lower back pain, insomnia, depression comes today for followup on the last visit patient was started on Cymbalta as per patient it does not help him with the symptoms of pain and would like to go back on tramadol which helps him better also taking Neurontin, as per patient he has scheduled appointment with pain management in August and like to continue with tramadol. Patient denies any new symptoms denies any incontinence, he is to smoke cigarettes, I have again counseled patient to quit smoking, he also has history of vitamin D deficiency, we will start taking over-the-counter vitamin D supplement 2000 units daily.  Past Medical History  Diagnosis Date  . Anxiety   . Spina bifida   . Depression     Past Surgical History  Procedure Laterality Date  . Back surgery        Medication List       This list is accurate as of: 12/23/14 11:31 AM.  Always use your most recent med list.               acetaminophen-codeine 300-30 MG per tablet  Commonly known as:  TYLENOL #3  Take 1 tablet by mouth every 8 (eight) hours as needed for moderate pain.     ALPRAZolam 0.5 MG tablet  Commonly known as:  XANAX  Take 0.5 mg by mouth at bedtime as needed for anxiety.     gabapentin 400 MG capsule  Commonly known as:  NEURONTIN  Take 1 capsule (400 mg total) by mouth 4 (four) times daily.     HYDROcodone-acetaminophen 5-325 MG per tablet  Commonly known as:  NORCO/VICODIN  Take 1 tablet by mouth every 4 (four) hours as needed for moderate pain or severe pain.     ibuprofen 200 MG tablet  Commonly known as:  ADVIL,MOTRIN  Take 800 mg by mouth every 6 (six) hours as needed for headache or moderate pain.     mirtazapine 15 MG tablet  Commonly known as:  REMERON  Take 1 tablet (15 mg total) by mouth at bedtime.     traMADol 50 MG tablet  Commonly known as:  ULTRAM  Take 1 tablet (50 mg total) by mouth every 8 (eight) hours as needed for moderate pain.     Vitamin D (Ergocalciferol) 50000 UNITS Caps capsule  Commonly known as:  DRISDOL  Take 1 capsule (50,000 Units total) by mouth every 7 (seven) days.        Meds ordered this encounter  Medications  . gabapentin (NEURONTIN) 400 MG capsule    Sig: Take 1 capsule (400 mg total) by mouth 4 (four) times daily.    Dispense:  120 capsule    Refill:  3  . traMADol (ULTRAM) 50 MG tablet    Sig: Take 1 tablet (50 mg total) by mouth every 8 (eight) hours as needed for moderate pain.    Dispense:  60 tablet    Refill:  0    Immunization History  Administered Date(s) Administered  . Influenza,inj,Quad PF,36+ Mos 04/02/2014  . Pneumococcal Polysaccharide-23 04/29/2014    Family History  Problem Relation Age of Onset  . Mental illness Mother     PANIC ATTACKS  . Heart  disease Father   . Spina bifida Sister   . Cancer Paternal Grandfather     History  Substance Use Topics  . Smoking status: Current Every Day Smoker -- 1.00 packs/day for 30 years    Types: Cigarettes  . Smokeless tobacco: Never Used  . Alcohol Use: No    Review of Systems   As noted in HPI  Filed Vitals:   12/23/14 1114  BP: 125/85  Pulse: 96  Temp: 98 F (36.7 C)  Resp: 16    Physical Exam  Physical Exam  Constitutional: No distress.  Eyes: EOM are normal. Pupils are equal, round, and reactive to light.  Cardiovascular: Normal rate and regular rhythm.   Pulmonary/Chest: Breath sounds normal. No respiratory distress. He has no wheezes. He has no rales.  Musculoskeletal: He exhibits no edema.    CBC    Component Value Date/Time   WBC 8.6 04/02/2014 1638   RBC 5.39 04/02/2014 1638   HGB 16.3 04/02/2014 1638   HCT 46.5 04/02/2014 1638   PLT  218 04/02/2014 1638   MCV 86.3 04/02/2014 1638   LYMPHSABS 2.6 04/02/2014 1638   MONOABS 0.4 04/02/2014 1638   EOSABS 0.1 04/02/2014 1638   BASOSABS 0.1 04/02/2014 1638    CMP     Component Value Date/Time   NA 134* 04/02/2014 1638   K 3.5 04/02/2014 1638   CL 104 04/02/2014 1638   CO2 22 04/02/2014 1638   GLUCOSE 94 04/02/2014 1638   BUN 17 04/02/2014 1638   CREATININE 1.14 04/02/2014 1638   CREATININE 0.95 03/13/2014 1218   CALCIUM 9.5 04/02/2014 1638   PROT 7.1 04/02/2014 1638   ALBUMIN 4.5 04/02/2014 1638   AST 15 04/02/2014 1638   ALT 32 04/02/2014 1638   ALKPHOS 73 04/02/2014 1638   BILITOT 0.5 04/02/2014 1638   GFRNONAA 78 04/02/2014 1638   GFRNONAA >90 03/13/2014 1218   GFRAA >89 04/02/2014 1638   GFRAA >90 03/13/2014 1218    No results found for: CHOL  No results found for: HGBA1C  Lab Results  Component Value Date/Time   AST 15 04/02/2014 04:38 PM    Assessment and Plan  Chronic low back pain - Plan:patient has stopped taking Cymbalta, I have discontinued and patient is given refill on tramadol, continue with gabapentin (NEURONTIN) 400 MG capsule, patient has a scheduled appointment with pain management in August.  Insomnia/Depression Continue with Remeron  Tobacco use disorder Again counseled patient to quit smoking    Return in about 3 months (around 03/25/2015), or if symptoms worsen or fail to improve.   This note has been created with Education officer, environmental. Any transcriptional errors are unintentional.    Doris Cheadle, MD

## 2015-01-08 ENCOUNTER — Other Ambulatory Visit: Payer: Self-pay

## 2015-01-10 ENCOUNTER — Telehealth: Payer: Self-pay | Admitting: Internal Medicine

## 2015-01-10 MED ORDER — TRAMADOL HCL 50 MG PO TABS: 50.0000 mg | ORAL_TABLET | Freq: Three times a day (TID) | ORAL | Status: DC | PRN

## 2015-01-10 NOTE — Telephone Encounter (Signed)
Patient called requesting a refill  On his tramadol Prescription printed-waiting on a signature

## 2015-01-10 NOTE — Telephone Encounter (Signed)
Patient called to request a med refill for Tramadol, please f/u with pt. °

## 2015-01-23 ENCOUNTER — Encounter: Payer: Self-pay | Admitting: Physical Medicine & Rehabilitation

## 2015-01-29 ENCOUNTER — Telehealth: Payer: Self-pay | Admitting: Internal Medicine

## 2015-01-29 MED ORDER — TRAMADOL HCL 50 MG PO TABS: 50.0000 mg | ORAL_TABLET | Freq: Three times a day (TID) | ORAL | Status: DC | PRN

## 2015-01-29 NOTE — Telephone Encounter (Signed)
Patient called to request a med refill for traMADol (ULTRAM) 50 MG tablet. Please f/u with pt. °

## 2015-01-29 NOTE — Telephone Encounter (Signed)
Patient called for refill on his tramadol Prescription printed and is at the front desk

## 2015-02-17 ENCOUNTER — Telehealth: Payer: Self-pay | Admitting: Internal Medicine

## 2015-02-17 NOTE — Telephone Encounter (Signed)
Patient called requesting medication refill for traMADol (ULTRAM) 50 MG tablet.  Please f/u

## 2015-02-18 ENCOUNTER — Encounter: Payer: Self-pay | Admitting: Physical Medicine & Rehabilitation

## 2015-02-18 NOTE — Telephone Encounter (Signed)
Patient called requesting medication refill for traMADol (ULTRAM) 50 MG tablet.  Please f/u

## 2015-02-19 MED ORDER — TRAMADOL HCL 50 MG PO TABS: 50.0000 mg | ORAL_TABLET | Freq: Three times a day (TID) | ORAL | Status: DC | PRN

## 2015-02-19 NOTE — Telephone Encounter (Signed)
Patient called requesting a refill on his tramadol Prescription printed and is at the front desk 

## 2015-03-04 ENCOUNTER — Telehealth: Payer: Self-pay | Admitting: Internal Medicine

## 2015-03-04 NOTE — Telephone Encounter (Signed)
Patient called requesting a medication refill for Tramadol. Please follow up with [email or web address]

## 2015-03-07 NOTE — Telephone Encounter (Signed)
Patient requesting a refill on his tramadol Can this be refilled

## 2015-03-07 NOTE — Telephone Encounter (Signed)
Rx for 60 filled by Dr. Hyman Hopes on 02/20/15. Will provide another 60 on 03/23/15, no earlier

## 2015-03-12 NOTE — Telephone Encounter (Signed)
Patient called to request medication refill   traMADol (ULTRAM) 50 MG tablet

## 2015-03-13 ENCOUNTER — Telehealth: Payer: Self-pay | Admitting: Internal Medicine

## 2015-03-13 NOTE — Telephone Encounter (Signed)
Patient called for medication refill for traMADol (ULTRAM) 50 MG tabletl please f/u

## 2015-03-14 NOTE — Telephone Encounter (Signed)
Patient called requesting medication refill. Please f/u

## 2015-03-17 NOTE — Telephone Encounter (Signed)
Patient called for medication refill for traMADol (ULTRAM) 50 MG tabletl please f/u  °

## 2015-03-18 ENCOUNTER — Telehealth: Payer: Self-pay | Admitting: Internal Medicine

## 2015-03-18 NOTE — Telephone Encounter (Signed)
Patient called requesting med refill for traMADol (ULTRAM) 50 MG tablet. Please f/u

## 2015-03-20 ENCOUNTER — Telehealth: Payer: Self-pay | Admitting: Internal Medicine

## 2015-03-20 NOTE — Telephone Encounter (Signed)
Patient called for his med refill on traMADol (ULTRAM) 50 MG tablet. Please f/u

## 2015-03-21 NOTE — Telephone Encounter (Signed)
Patient called asking for med refill on traMADol (ULTRAM) 50 MG tablet. Please f/u

## 2015-03-24 NOTE — Telephone Encounter (Signed)
Patient called requesting a request to fill his medication, Tramadol. Please follow up

## 2015-03-24 NOTE — Telephone Encounter (Signed)
Patient called for med refill on traMADol (ULTRAM) 50 MG tablet. Please f/u

## 2015-04-18 NOTE — Telephone Encounter (Signed)
Pt. Called requesting a med refill for traMADol (ULTRAM) 50 MG tablet. Please f/u with pt.

## 2015-04-21 ENCOUNTER — Telehealth: Payer: Self-pay | Admitting: Internal Medicine

## 2015-04-21 DIAGNOSIS — G8929 Other chronic pain: Secondary | ICD-10-CM

## 2015-04-21 DIAGNOSIS — M545 Low back pain: Principal | ICD-10-CM

## 2015-04-21 NOTE — Telephone Encounter (Signed)
Patient called and requested a med refill for Tramadol. Please f/u  °

## 2015-04-22 NOTE — Telephone Encounter (Signed)
Patient requesting refill on his tramadol

## 2015-04-23 ENCOUNTER — Telehealth: Payer: Self-pay | Admitting: Internal Medicine

## 2015-04-23 MED ORDER — TRAMADOL HCL 50 MG PO TABS: 50.0000 mg | ORAL_TABLET | Freq: Three times a day (TID) | ORAL | Status: DC | PRN

## 2015-04-23 NOTE — Telephone Encounter (Signed)
Attempted several times to call patient with no answer. Patient prescription is ready for pick up, and is in the blue folder.

## 2015-04-23 NOTE — Telephone Encounter (Signed)
Called patient. Reached busy signal.

## 2015-04-23 NOTE — Telephone Encounter (Signed)
Tramadol refilled.

## 2015-05-02 ENCOUNTER — Ambulatory Visit: Payer: Self-pay | Attending: Family Medicine | Admitting: Family Medicine

## 2015-05-02 ENCOUNTER — Encounter: Payer: Self-pay | Admitting: Family Medicine

## 2015-05-02 VITALS — BP 127/79 | HR 82 | Temp 98.7°F | Resp 18 | Ht 75.0 in | Wt 243.0 lb

## 2015-05-02 DIAGNOSIS — R202 Paresthesia of skin: Secondary | ICD-10-CM | POA: Insufficient documentation

## 2015-05-02 DIAGNOSIS — G8929 Other chronic pain: Secondary | ICD-10-CM | POA: Insufficient documentation

## 2015-05-02 DIAGNOSIS — M542 Cervicalgia: Secondary | ICD-10-CM | POA: Insufficient documentation

## 2015-05-02 DIAGNOSIS — R200 Anesthesia of skin: Secondary | ICD-10-CM | POA: Insufficient documentation

## 2015-05-02 DIAGNOSIS — Z0000 Encounter for general adult medical examination without abnormal findings: Secondary | ICD-10-CM

## 2015-05-02 DIAGNOSIS — M545 Low back pain: Secondary | ICD-10-CM | POA: Insufficient documentation

## 2015-05-02 DIAGNOSIS — Z114 Encounter for screening for human immunodeficiency virus [HIV]: Secondary | ICD-10-CM | POA: Insufficient documentation

## 2015-05-02 DIAGNOSIS — Z23 Encounter for immunization: Secondary | ICD-10-CM | POA: Insufficient documentation

## 2015-05-02 LAB — COMPLETE METABOLIC PANEL WITH GFR
ALT: 30 U/L (ref 9–46)
AST: 14 U/L (ref 10–40)
Albumin: 4.4 g/dL (ref 3.6–5.1)
Alkaline Phosphatase: 68 U/L (ref 40–115)
BUN: 17 mg/dL (ref 7–25)
CHLORIDE: 108 mmol/L (ref 98–110)
CO2: 23 mmol/L (ref 20–31)
Calcium: 9.2 mg/dL (ref 8.6–10.3)
Creat: 0.93 mg/dL (ref 0.60–1.35)
Glucose, Bld: 103 mg/dL — ABNORMAL HIGH (ref 65–99)
Potassium: 4.6 mmol/L (ref 3.5–5.3)
Sodium: 140 mmol/L (ref 135–146)
Total Bilirubin: 0.5 mg/dL (ref 0.2–1.2)
Total Protein: 7.1 g/dL (ref 6.1–8.1)

## 2015-05-02 LAB — HIV ANTIBODY (ROUTINE TESTING W REFLEX): HIV: NONREACTIVE

## 2015-05-02 MED ORDER — PREGABALIN 50 MG PO CAPS: 50.0000 mg | ORAL_CAPSULE | Freq: Three times a day (TID) | ORAL | Status: DC

## 2015-05-02 MED ORDER — ACETAMINOPHEN-CODEINE #3 300-30 MG PO TABS: 1.0000 | ORAL_TABLET | Freq: Three times a day (TID) | ORAL | Status: DC | PRN

## 2015-05-02 MED ORDER — GABAPENTIN 400 MG PO CAPS: 400.0000 mg | ORAL_CAPSULE | Freq: Three times a day (TID) | ORAL | Status: DC

## 2015-05-02 NOTE — Progress Notes (Signed)
Subjective:  Patient ID: Paul Baldwin, male    DOB: 1971-02-18  Age: 44 y.o. MRN: [id number]  CC: Establish Care   HPI Paul Baldwin presents for    1. Hand pain: started with burning one year ago. Worsening. Started with L leg and and one hand. Now both hands. Now both hands. Hand pins and needle burning sensation. Has trouble making a fist. Only fully able to flex middle fingers. Patient had cervical x-rays in 2015 that revealed cervical spinal stenosis.   2. Chronic back pain: since childhood. Has hx of spina bifida. S/p teethered cord release. Pain is worse with prolonged standing and walking. TID gabapentin and tramadol are not helping very much. He intermittently has trouble passing urine when pain is exacerbated.    Social History  Substance Use Topics  . Smoking status: Current Every Day Smoker -- 1.00 packs/day for 30 years    Types: Cigarettes  . Smokeless tobacco: Never Used  . Alcohol Use: No    Outpatient Prescriptions Prior to Visit  Medication Sig Dispense Refill  . acetaminophen-codeine (TYLENOL #3) 300-30 MG per tablet Take 1 tablet by mouth every 8 (eight) hours as needed for moderate pain. 60 tablet 0  . ALPRAZolam (XANAX) 0.5 MG tablet Take 0.5 mg by mouth at bedtime as needed for anxiety.    . gabapentin (NEURONTIN) 400 MG capsule Take 1 capsule (400 mg total) by mouth 4 (four) times daily. 120 capsule 3  . HYDROcodone-acetaminophen (NORCO/VICODIN) 5-325 MG per tablet Take 1 tablet by mouth every 4 (four) hours as needed for moderate pain or severe pain. 10 tablet 0  . ibuprofen (ADVIL,MOTRIN) 200 MG tablet Take 800 mg by mouth every 6 (six) hours as needed for headache or moderate pain.     . mirtazapine (REMERON) 15 MG tablet Take 1 tablet (15 mg total) by mouth at bedtime. 30 tablet 3  . traMADol (ULTRAM) 50 MG tablet Take 1 tablet (50 mg total) by mouth 3 (three) times daily as needed. 60 tablet 0  . Vitamin D, Ergocalciferol, (DRISDOL) 50000  UNITS CAPS capsule Take 1 capsule (50,000 Units total) by mouth every 7 (seven) days. 12 capsule 0   No facility-administered medications prior to visit.    ROS Review of Systems  Constitutional: Negative for fever, chills, fatigue and unexpected weight change.  Eyes: Negative for visual disturbance.  Respiratory: Negative for cough and shortness of breath.   Cardiovascular: Negative for chest pain, palpitations and leg swelling.  Gastrointestinal: Negative for nausea, vomiting, abdominal pain, diarrhea, constipation and blood in stool.  Endocrine: Negative for polydipsia, polyphagia and polyuria.  Musculoskeletal: Positive for back pain and neck pain. Negative for myalgias, arthralgias and gait problem.  Skin: Negative for rash.  Allergic/Immunologic: Negative for immunocompromised state.  Neurological: Positive for numbness.  Hematological: Negative for adenopathy. Does not bruise/bleed easily.  Psychiatric/Behavioral: Negative for suicidal ideas, sleep disturbance and dysphoric mood. The patient is not nervous/anxious.     Objective:  BP 127/79 mmHg  Pulse 82  Temp(Src) 98.7 F (37.1 C) (Oral)  Resp 18  Ht 6\' 3"  (1.905 m)  Wt 243 lb (110.224 kg)  BMI 30.37 kg/m2  SpO2 97%  BP/Weight 05/02/2015 12/23/2014 11/28/2014  Systolic BP - 125 120  Diastolic BP - 85 88  Wt. (Lbs) 243 244 246.6  BMI 30.37 29.71 30.03    Physical Exam  Constitutional: He appears well-developed and well-nourished. No distress.  HENT:  Head: Normocephalic and atraumatic.  Neck: Normal  range of motion. Neck supple. Muscular tenderness present. No spinous process tenderness present.  Cardiovascular: Normal rate, regular rhythm, normal heart sounds and intact distal pulses.   Pulmonary/Chest: Effort normal and breath sounds normal.  Musculoskeletal: He exhibits no edema.       Lumbar back: He exhibits tenderness.       Back:  Full ROM of fingers Patient able to make a fist without difficulty in  both hands during test of arm strength   Neurological: He is alert.  Skin: Skin is warm and dry. No rash noted. No erythema.  Psychiatric: He has a normal mood and affect.     Assessment & Plan:   Problem List Items Addressed This Visit    Chronic low back pain (Chronic)    A: chronic persistent pain P:  Transition back to tylenol #3 from tramadol lyrica to replace gabapentin Refilled gabapentin in the meantime       Relevant Medications   acetaminophen-codeine (TYLENOL #3) 300-30 MG tablet   pregabalin (LYRICA) 50 MG capsule   gabapentin (NEURONTIN) 400 MG capsule   Other Relevant Orders   COMPLETE METABOLIC PANEL WITH GFR   Numbness and tingling in hands (Chronic)    A: cervical pain with radicular symptoms  P:  lyrica          Other Visit Diagnoses    Cervicalgia    -  Primary    Relevant Medications    acetaminophen-codeine (TYLENOL #3) 300-30 MG tablet    pregabalin (LYRICA) 50 MG capsule    gabapentin (NEURONTIN) 400 MG capsule    Screening for HIV (human immunodeficiency virus)        Relevant Orders    HIV antibody (with reflex)    Healthcare maintenance        Relevant Orders    Flu Vaccine QUAD 36+ mos PF IM (Fluarix & Fluzone Quad PF) (Completed)       No orders of the defined types were placed in this encounter.    Follow-up: No Follow-up on file.   Dessa Phi MD

## 2015-05-02 NOTE — Assessment & Plan Note (Signed)
A: chronic persistent pain P:  Transition back to tylenol #3 from tramadol lyrica to replace gabapentin Refilled gabapentin in the meantime

## 2015-05-02 NOTE — Progress Notes (Signed)
Patient complain of back pain scaled at a 8, described as numbing in hands and fingers, described at burning and tingling. Patient complains of loss mobility in left leg.  Patient needs refill on gabapentin,

## 2015-05-02 NOTE — Patient Instructions (Signed)
Paul Baldwin was seen today for establish care.  Diagnoses and all orders for this visit:  Cervicalgia -     acetaminophen-codeine (TYLENOL #3) 300-30 MG tablet; Take 1 tablet by mouth every 8 (eight) hours as needed for moderate pain. -     pregabalin (LYRICA) 50 MG capsule; Take 1 capsule (50 mg total) by mouth 3 (three) times daily. -     gabapentin (NEURONTIN) 400 MG capsule; Take 1 capsule (400 mg total) by mouth 3 (three) times daily.  Chronic low back pain -     acetaminophen-codeine (TYLENOL #3) 300-30 MG tablet; Take 1 tablet by mouth every 8 (eight) hours as needed for moderate pain. -     pregabalin (LYRICA) 50 MG capsule; Take 1 capsule (50 mg total) by mouth 3 (three) times daily. -     gabapentin (NEURONTIN) 400 MG capsule; Take 1 capsule (400 mg total) by mouth 3 (three) times daily. -     COMPLETE METABOLIC PANEL WITH GFR  Paresthesia of both hands  Screening for HIV (human immunodeficiency virus) -     HIV antibody (with reflex)    Apply for PASS for lyrica at the onsite pharmacy   Refilled gabapentin for now Transition to tylenol #3  F/u in 2 months   Dr. Armen PickupFunches

## 2015-05-02 NOTE — Assessment & Plan Note (Signed)
A: cervical pain with radicular symptoms  P:  lyrica

## 2015-05-25 ENCOUNTER — Other Ambulatory Visit: Payer: Self-pay | Admitting: Family Medicine

## 2015-05-26 ENCOUNTER — Telehealth: Payer: Self-pay

## 2015-05-26 DIAGNOSIS — G8929 Other chronic pain: Secondary | ICD-10-CM

## 2015-05-26 DIAGNOSIS — M545 Low back pain: Principal | ICD-10-CM

## 2015-05-26 DIAGNOSIS — M542 Cervicalgia: Secondary | ICD-10-CM

## 2015-05-26 MED ORDER — GABAPENTIN 400 MG PO CAPS: 400.0000 mg | ORAL_CAPSULE | Freq: Three times a day (TID) | ORAL | Status: DC

## 2015-05-26 NOTE — Telephone Encounter (Signed)
Please call patient to clarify  Patient has been transitioned to lyrica Does he have lyrica? Has he been able to get it? Did he turn in the Rx and apply for PASS?  Gabapentin refill sent in.  He should not take both lyrica and gabapentin

## 2015-05-28 NOTE — Telephone Encounter (Signed)
-----   Message from Dessa PhiJosalyn Funches, MD sent at 05/05/2015  9:02 AM EDT ----- Normal labs

## 2015-05-28 NOTE — Telephone Encounter (Signed)
Date of birth verified bypt Normal lab given   Pt advised Rx tylenol #3 was given on 05/01/2016 Stated tramadol was given Rx in the past and works better then Tylenol #3

## 2015-05-28 NOTE — Telephone Encounter (Signed)
Patient called and requested a med refill for Tramadol, please f/u  °

## 2015-06-02 MED ORDER — TRAMADOL HCL 50 MG PO TABS: 50.0000 mg | ORAL_TABLET | Freq: Three times a day (TID) | ORAL | Status: DC | PRN

## 2015-06-02 NOTE — Telephone Encounter (Signed)
Patient called to check on the status of the med refill request for Tramadol, please f/u

## 2015-06-02 NOTE — Telephone Encounter (Signed)
Pt notified Rx at front office  

## 2015-06-02 NOTE — Telephone Encounter (Signed)
Tramadol refilled Please inform patient  

## 2015-06-03 ENCOUNTER — Telehealth: Payer: Self-pay | Admitting: Family Medicine

## 2015-06-03 NOTE — Telephone Encounter (Signed)
Pt. Came in today to [place] to fil out councerning  his disability. Please f/u with pt.

## 2015-06-04 NOTE — Telephone Encounter (Signed)
Paperwork received.

## 2015-06-06 NOTE — Telephone Encounter (Signed)
Called patient He does walk with a cane. He prefers that his forms be mailed to him. Forms copy and copy scanned. Forms mailed to patient.

## 2015-06-10 ENCOUNTER — Other Ambulatory Visit: Payer: Self-pay | Admitting: Family Medicine

## 2015-06-27 ENCOUNTER — Other Ambulatory Visit: Payer: Self-pay | Admitting: Family Medicine

## 2015-07-01 NOTE — Telephone Encounter (Signed)
Pt. Called requesting a med refill on Tramadol. Please f/u with pt. °

## 2015-07-03 ENCOUNTER — Telehealth: Payer: Self-pay | Admitting: Family Medicine

## 2015-07-03 DIAGNOSIS — M542 Cervicalgia: Secondary | ICD-10-CM

## 2015-07-03 DIAGNOSIS — M545 Low back pain: Principal | ICD-10-CM

## 2015-07-03 DIAGNOSIS — G8929 Other chronic pain: Secondary | ICD-10-CM

## 2015-07-03 MED ORDER — TRAMADOL HCL 50 MG PO TABS: 50.0000 mg | ORAL_TABLET | Freq: Three times a day (TID) | ORAL | Status: DC | PRN

## 2015-07-03 NOTE — Telephone Encounter (Signed)
Pt. Called stating he needs a med refill on gabapentin. Please f/u with pt.

## 2015-07-03 NOTE — Telephone Encounter (Signed)
Pt. Called requesting a med refill on Tramadol. Pt. Stated she is out of this medication. Please f/u with pt.

## 2015-07-03 NOTE — Telephone Encounter (Signed)
Pt aware rx at front office

## 2015-07-03 NOTE — Telephone Encounter (Signed)
Tramadol ready for pick up  

## 2015-07-03 NOTE — Telephone Encounter (Signed)
Done

## 2015-07-11 ENCOUNTER — Telehealth: Payer: Self-pay | Admitting: Family Medicine

## 2015-07-11 MED ORDER — GABAPENTIN 400 MG PO CAPS
ORAL_CAPSULE | ORAL | Status: DC
Start: 2015-07-11 — End: 2015-08-20

## 2015-07-11 NOTE — Telephone Encounter (Signed)
Rx refill send to [facility] pharmacy

## 2015-07-11 NOTE — Telephone Encounter (Signed)
Pt. Called requesting a med refill on Gabapentin. Pt. Stated he is out of the medication. Please f/u with pt.

## 2015-07-21 ENCOUNTER — Ambulatory Visit: Payer: Self-pay | Admitting: Family Medicine

## 2015-07-29 ENCOUNTER — Telehealth: Payer: Self-pay | Admitting: Family Medicine

## 2015-07-29 DIAGNOSIS — G8929 Other chronic pain: Secondary | ICD-10-CM

## 2015-07-29 DIAGNOSIS — M545 Low back pain, unspecified: Secondary | ICD-10-CM

## 2015-07-29 DIAGNOSIS — M542 Cervicalgia: Secondary | ICD-10-CM

## 2015-07-29 MED ORDER — TRAMADOL HCL 50 MG PO TABS: 50.0000 mg | ORAL_TABLET | Freq: Three times a day (TID) | ORAL | Status: DC | PRN

## 2015-07-29 NOTE — Telephone Encounter (Signed)
Pt calling to request refill on tramadol. Sadie Reynolds, ASA

## 2015-07-29 NOTE — Telephone Encounter (Signed)
Tramadol ready for pick up  

## 2015-07-30 NOTE — Telephone Encounter (Signed)
Unable to contact Pt  "not accepting call at this time"   Rx Tramadol at front office ready to be pick up

## 2015-08-20 ENCOUNTER — Other Ambulatory Visit: Payer: Self-pay | Admitting: *Deleted

## 2015-08-20 MED ORDER — GABAPENTIN 400 MG PO CAPS: ORAL_CAPSULE | ORAL | Status: DC

## 2015-09-01 ENCOUNTER — Telehealth: Payer: Self-pay | Admitting: Family Medicine

## 2015-09-01 DIAGNOSIS — M545 Low back pain: Principal | ICD-10-CM

## 2015-09-01 DIAGNOSIS — M542 Cervicalgia: Secondary | ICD-10-CM

## 2015-09-01 DIAGNOSIS — G8929 Other chronic pain: Secondary | ICD-10-CM

## 2015-09-01 NOTE — Telephone Encounter (Signed)
Patient is requesting a prescription for tramadol.  Patient would like a letter stating he is unable to work, he is in the process of applying for Medicaid  Please follow up with patient

## 2015-09-02 MED ORDER — TRAMADOL HCL 50 MG PO TABS: 50.0000 mg | ORAL_TABLET | Freq: Three times a day (TID) | ORAL | Status: DC | PRN

## 2015-09-02 NOTE — Telephone Encounter (Signed)
Tramadol refilled and ready for pick up  Letter ready for pick up

## 2015-09-03 NOTE — Telephone Encounter (Signed)
Unable to contact pt  Phone number not a working number  Rx and letter at front office

## 2015-09-29 ENCOUNTER — Telehealth: Payer: Self-pay | Admitting: Family Medicine

## 2015-09-29 DIAGNOSIS — M542 Cervicalgia: Secondary | ICD-10-CM

## 2015-09-29 DIAGNOSIS — M545 Low back pain: Principal | ICD-10-CM

## 2015-09-29 DIAGNOSIS — G8929 Other chronic pain: Secondary | ICD-10-CM

## 2015-09-29 NOTE — Telephone Encounter (Signed)
Patient is requesting medication refill for tramadol and gabapentin.Marland Kitchen.Marland Kitchen.Marland Kitchen.Marland Kitchen.please follow up

## 2015-09-30 MED ORDER — GABAPENTIN 400 MG PO CAPS: ORAL_CAPSULE | ORAL | Status: DC

## 2015-09-30 MED ORDER — TRAMADOL HCL 50 MG PO TABS: 50.0000 mg | ORAL_TABLET | Freq: Three times a day (TID) | ORAL | Status: DC | PRN

## 2015-09-30 NOTE — Telephone Encounter (Signed)
LVM Rx at front office ready to be pickup 

## 2015-09-30 NOTE — Telephone Encounter (Signed)
Tramadol refilled and ready for pick up Gabapentin refilled

## 2015-10-07 ENCOUNTER — Ambulatory Visit: Payer: Self-pay | Attending: Family Medicine | Admitting: Family Medicine

## 2015-10-07 ENCOUNTER — Encounter: Payer: Self-pay | Admitting: Family Medicine

## 2015-10-07 VITALS — BP 97/67 | HR 83 | Temp 98.5°F | Resp 16 | Ht 75.0 in | Wt 231.0 lb

## 2015-10-07 DIAGNOSIS — Z79899 Other long term (current) drug therapy: Secondary | ICD-10-CM | POA: Insufficient documentation

## 2015-10-07 DIAGNOSIS — F17210 Nicotine dependence, cigarettes, uncomplicated: Secondary | ICD-10-CM | POA: Insufficient documentation

## 2015-10-07 DIAGNOSIS — M545 Low back pain: Secondary | ICD-10-CM | POA: Insufficient documentation

## 2015-10-07 DIAGNOSIS — F418 Other specified anxiety disorders: Secondary | ICD-10-CM

## 2015-10-07 DIAGNOSIS — G8929 Other chronic pain: Secondary | ICD-10-CM | POA: Insufficient documentation

## 2015-10-07 DIAGNOSIS — M542 Cervicalgia: Secondary | ICD-10-CM | POA: Insufficient documentation

## 2015-10-07 DIAGNOSIS — F329 Major depressive disorder, single episode, unspecified: Secondary | ICD-10-CM | POA: Insufficient documentation

## 2015-10-07 DIAGNOSIS — F419 Anxiety disorder, unspecified: Secondary | ICD-10-CM | POA: Insufficient documentation

## 2015-10-07 MED ORDER — PREGABALIN 50 MG PO CAPS: 50.0000 mg | ORAL_CAPSULE | Freq: Three times a day (TID) | ORAL | Status: DC

## 2015-10-07 MED ORDER — TRAMADOL HCL 50 MG PO TABS
50.0000 mg | ORAL_TABLET | Freq: Four times a day (QID) | ORAL | Status: DC | PRN
Start: 2015-10-07 — End: 2015-11-20

## 2015-10-07 MED ORDER — GABAPENTIN 300 MG PO CAPS: 600.0000 mg | ORAL_CAPSULE | Freq: Three times a day (TID) | ORAL | Status: DC

## 2015-10-07 MED ORDER — DULOXETINE HCL 30 MG PO CPEP: 30.0000 mg | ORAL_CAPSULE | Freq: Every day | ORAL | Status: DC

## 2015-10-07 MED ORDER — HYDROXYZINE HCL 25 MG PO TABS: 25.0000 mg | ORAL_TABLET | Freq: Three times a day (TID) | ORAL | Status: DC | PRN

## 2015-10-07 NOTE — Patient Instructions (Addendum)
Paul Baldwin was seen today for neck pain.  Diagnoses and all orders for this visit:  Cervicalgia -     pregabalin (LYRICA) 50 MG capsule; Take 1 capsule (50 mg total) by mouth 3 (three) times daily. For PASS -     Discontinue: gabapentin (NEURONTIN) 300 MG capsule; Take 2 capsules (600 mg total) by mouth 3 (three) times daily. -     gabapentin (NEURONTIN) 300 MG capsule; Take 2 capsules (600 mg total) by mouth 3 (three) times daily. -     traMADol (ULTRAM) 50 MG tablet; Take 1 tablet (50 mg total) by mouth every 6 (six) hours as needed.  Chronic low back pain -     pregabalin (LYRICA) 50 MG capsule; Take 1 capsule (50 mg total) by mouth 3 (three) times daily. For PASS -     gabapentin (NEURONTIN) 300 MG capsule; Take 2 capsules (600 mg total) by mouth 3 (three) times daily. -     traMADol (ULTRAM) 50 MG tablet; Take 1 tablet (50 mg total) by mouth every 6 (six) hours as needed.  Anxiety and depression -     DULoxetine (CYMBALTA) 30 MG capsule; Take 1 capsule (30 mg total) by mouth daily. -     hydrOXYzine (ATARAX/VISTARIL) 25 MG tablet; Take 1 tablet (25 mg total) by mouth 3 (three) times daily as needed.  drop off lyrica to on site pharmacy for PASS, patient assistance F/u in 6 weeks for chronic pain and depressed mood  Dr. Armen PickupFunches

## 2015-10-07 NOTE — Progress Notes (Signed)
Subjective:  Patient ID: Paul Baldwin, male    DOB: [birth year]  Age: 45 y.o. MRN: [id number]  CC: Neck Pain   HPI Paul Baldwin presents for   1. Neck pain: this is chronic. Has weakness in R hand. Uninsured.  Unemployed. Lives with 46 yo son. Compliant with all meds. Has not yet applied for patient assistance for lyrica. Also has chronic back pain. No fecal or urinary incontinence. No leg weakness. He admits to anxious and depressed mood.   Social History  Substance Use Topics  . Smoking status: Current Every Day Smoker -- 1.00 packs/day for 30 years    Types: Cigarettes  . Smokeless tobacco: Never Used  . Alcohol Use: No    Outpatient Prescriptions Prior to Visit  Medication Sig Dispense Refill  . gabapentin (NEURONTIN) 400 MG capsule take 1 capsule by mouth three times a day 90 capsule 2  . ibuprofen (ADVIL,MOTRIN) 200 MG tablet Take 800 mg by mouth every 6 (six) hours as needed for headache or moderate pain. Reported on 10/07/2015    . traMADol (ULTRAM) 50 MG tablet Take 1 tablet (50 mg total) by mouth every 8 (eight) hours as needed. 90 tablet 0  . Vitamin D, Ergocalciferol, (DRISDOL) 50000 UNITS CAPS capsule Take 1 capsule (50,000 Units total) by mouth every 7 (seven) days. 12 capsule 0  . ALPRAZolam (XANAX) 0.5 MG tablet Take 0.5 mg by mouth at bedtime as needed for anxiety. Reported on 10/07/2015    . mirtazapine (REMERON) 15 MG tablet Take 1 tablet (15 mg total) by mouth at bedtime. (Patient not taking: Reported on 10/07/2015) 30 tablet 3  . pregabalin (LYRICA) 50 MG capsule Take 1 capsule (50 mg total) by mouth 3 (three) times daily. (Patient not taking: Reported on 10/07/2015) 90 capsule 1   No facility-administered medications prior to visit.    ROS Review of Systems  Constitutional: Negative for fever, chills, fatigue and unexpected weight change.  Eyes: Negative for visual disturbance.  Respiratory: Negative for cough and shortness of breath.     Cardiovascular: Negative for chest pain, palpitations and leg swelling.  Gastrointestinal: Negative for nausea, vomiting, abdominal pain, diarrhea, constipation and blood in stool.  Endocrine: Negative for polydipsia, polyphagia and polyuria.  Musculoskeletal: Positive for back pain and neck pain. Negative for myalgias, arthralgias and gait problem.  Skin: Negative for rash.  Allergic/Immunologic: Negative for immunocompromised state.  Neurological: Positive for numbness.  Hematological: Negative for adenopathy. Does not bruise/bleed easily.  Psychiatric/Behavioral: Negative for suicidal ideas, sleep disturbance and dysphoric mood. The patient is not nervous/anxious.     Objective:  BP 97/67 mmHg  Pulse 83  Temp(Src) 98.5 F (36.9 C) (Oral)  Resp 16  Ht 6\' 3"  (1.905 m)  Wt 231 lb (104.781 kg)  BMI 28.87 kg/m2  SpO2 97%  BP/Weight 10/07/2015 05/02/2015 12/23/2014  Systolic BP 97 127 125  Diastolic BP 67 79 85  Wt. (Lbs) 231 243 244  BMI 28.87 30.37 29.71    Physical Exam  Constitutional: He appears well-developed and well-nourished. No distress.  HENT:  Head: Normocephalic and atraumatic.  Neck: Normal range of motion. Neck supple. Muscular tenderness present. No spinous process tenderness present.  Cardiovascular: Normal rate, regular rhythm, normal heart sounds and intact distal pulses.   Pulmonary/Chest: Effort normal and breath sounds normal.  Musculoskeletal: He exhibits no edema.       Lumbar back: He exhibits tenderness.       Back:  Full ROM of fingers  Patient able to make a fist without difficulty in both hands during test of arm strength   Neurological: He is alert.  Skin: Skin is warm and dry. No rash noted. No erythema.  Psychiatric: He has a normal mood and affect.   Depression screen PHQ 2/9 10/07/2015  Decreased Interest 3  Down, Depressed, Hopeless 3  PHQ - 2 Score 6  Altered sleeping 3  Tired, decreased energy 3  Change in appetite 3  Feeling bad or  failure about yourself  3  Trouble concentrating 2  Moving slowly or fidgety/restless 3  Suicidal thoughts 0  PHQ-9 Score 23    GAD 7 : Generalized Anxiety Score 10/07/2015  Nervous, Anxious, on Edge 3  Control/stop worrying 3  Worry too much - different things 3  Trouble relaxing 3  Restless 2  Easily annoyed or irritable 3  Afraid - awful might happen 1  Total GAD 7 Score 18      Assessment & Plan:   There are no diagnoses linked to this encounter. Paul Baldwin was seen today for neck pain.  Diagnoses and all orders for this visit:  Cervicalgia -     pregabalin (LYRICA) 50 MG capsule; Take 1 capsule (50 mg total) by mouth 3 (three) times daily. For PASS -     Discontinue: gabapentin (NEURONTIN) 300 MG capsule; Take 2 capsules (600 mg total) by mouth 3 (three) times daily. -     gabapentin (NEURONTIN) 300 MG capsule; Take 2 capsules (600 mg total) by mouth 3 (three) times daily. -     traMADol (ULTRAM) 50 MG tablet; Take 1 tablet (50 mg total) by mouth every 6 (six) hours as needed.  Chronic low back pain -     pregabalin (LYRICA) 50 MG capsule; Take 1 capsule (50 mg total) by mouth 3 (three) times daily. For PASS -     gabapentin (NEURONTIN) 300 MG capsule; Take 2 capsules (600 mg total) by mouth 3 (three) times daily. -     traMADol (ULTRAM) 50 MG tablet; Take 1 tablet (50 mg total) by mouth every 6 (six) hours as needed.  Anxiety and depression -     DULoxetine (CYMBALTA) 30 MG capsule; Take 1 capsule (30 mg total) by mouth daily. -     hydrOXYzine (ATARAX/VISTARIL) 25 MG tablet; Take 1 tablet (25 mg total) by mouth 3 (three) times daily as needed.   Meds ordered this encounter  Medications  . pregabalin (LYRICA) 50 MG capsule    Sig: Take 1 capsule (50 mg total) by mouth 3 (three) times daily. For PASS    Dispense:  90 capsule    Refill:  3  . DISCONTD: gabapentin (NEURONTIN) 300 MG capsule    Sig: Take 2 capsules (600 mg total) by mouth 3 (three) times daily.     Dispense:  180 capsule    Refill:  3  . gabapentin (NEURONTIN) 300 MG capsule    Sig: Take 2 capsules (600 mg total) by mouth 3 (three) times daily.    Dispense:  180 capsule    Refill:  3  . traMADol (ULTRAM) 50 MG tablet    Sig: Take 1 tablet (50 mg total) by mouth every 6 (six) hours as needed.    Dispense:  120 tablet    Refill:  0  . DULoxetine (CYMBALTA) 30 MG capsule    Sig: Take 1 capsule (30 mg total) by mouth daily.    Dispense:  30 capsule    Refill:  2  . hydrOXYzine (ATARAX/VISTARIL) 25 MG tablet    Sig: Take 1 tablet (25 mg total) by mouth 3 (three) times daily as needed.    Dispense:  60 tablet    Refill:  2    Follow-up: No Follow-up on file.   Dessa Phi MD

## 2015-10-07 NOTE — Progress Notes (Signed)
F/U neck pain  Burning feeling on both hands  Pain scale # 7 No suicidal thoughts in the past two weeks

## 2015-10-08 DIAGNOSIS — M542 Cervicalgia: Secondary | ICD-10-CM | POA: Insufficient documentation

## 2015-10-08 NOTE — Assessment & Plan Note (Signed)
A: persistent and severe P:  cymbalta  Atarax

## 2015-10-08 NOTE — Assessment & Plan Note (Signed)
A; chronic pain P: Increase tramadol and gabapentin Plan to add lyrica to replace gabapentin, patient given Rx to apply for med assistance, PASS 

## 2015-10-08 NOTE — Assessment & Plan Note (Signed)
A; chronic pain P: Increase tramadol and gabapentin Plan to add lyrica to replace gabapentin, patient given Rx to apply for med assistance, PASS

## 2015-10-22 MED FILL — GABAPENTIN 300 MG CAPSULE: 300 | 30 days supply | Qty: 180 | Fill #0

## 2015-10-22 MED FILL — DULoxetine HCL 30 MG CPEP: 30 | 30 days supply | Qty: 30 | Fill #0

## 2015-10-24 MED FILL — traMADol HCL 50 MG TABS: 50 | 30 days supply | Qty: 120 | Fill #0

## 2015-11-11 IMAGING — CR DG LUMBAR SPINE COMPLETE 4+V
5 series · 5 of 5 positions shown · non-contrast
Comparison: None.

CLINICAL DATA: Left-sided lower extremity numbness with difficulty
walking; history of spina bifida and previous lumbar surgery

EXAM:
LUMBAR SPINE - COMPLETE 4+ VIEW

[view not recorded (1 of 5)]
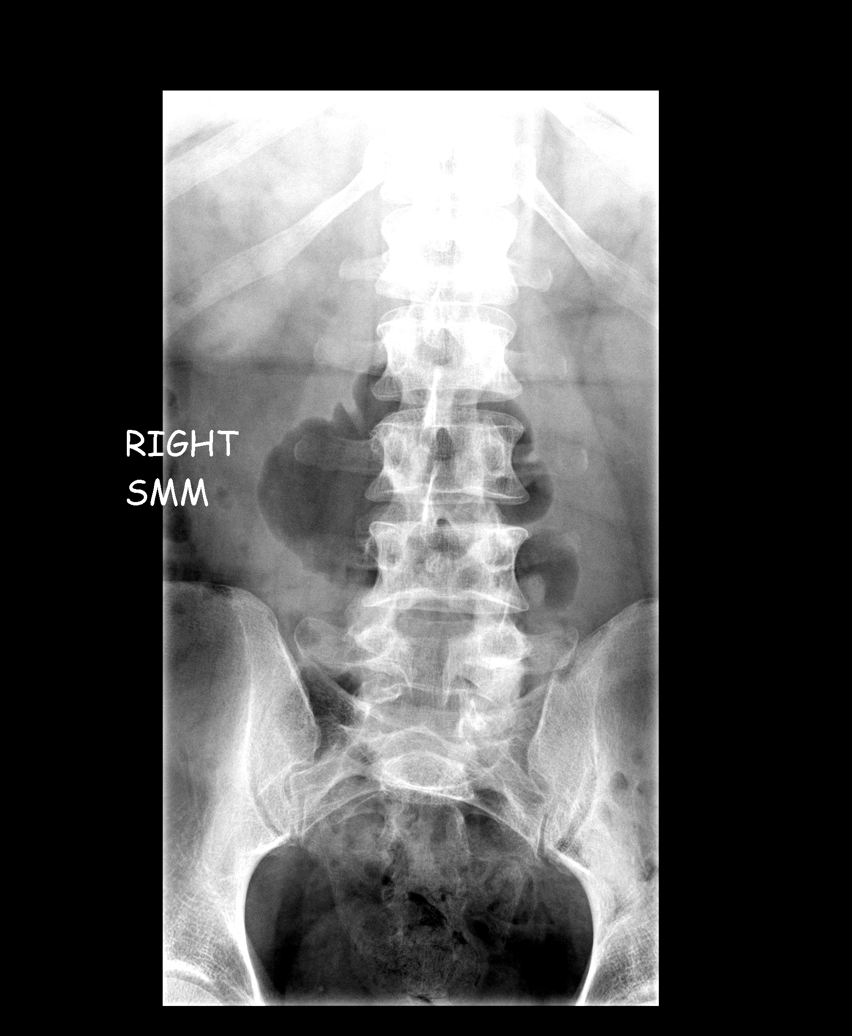

[view not recorded (2 of 5)]
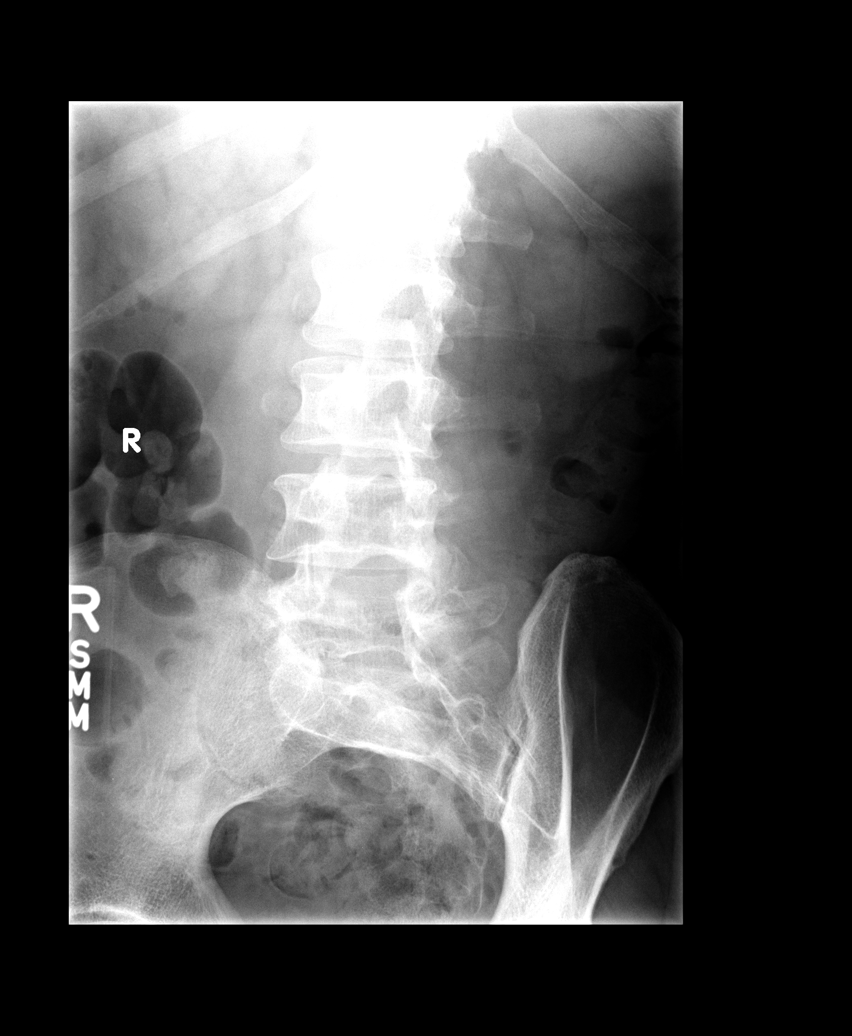

[view not recorded (3 of 5)]
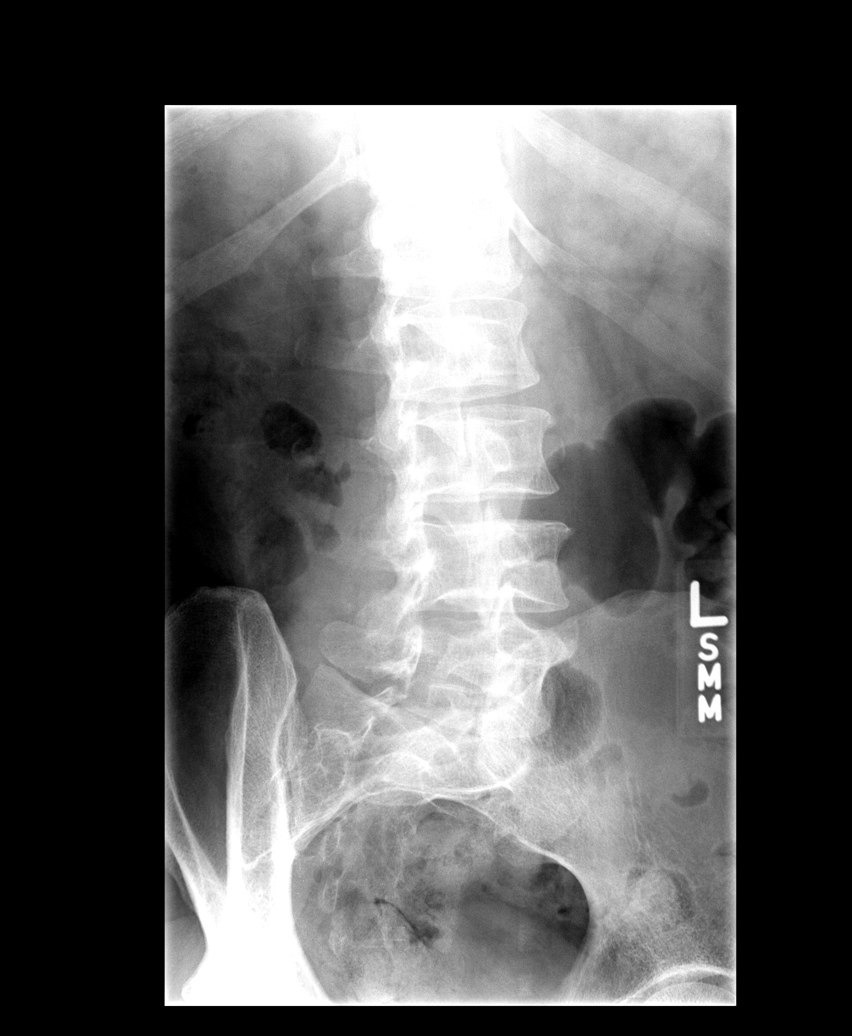

[view not recorded (4 of 5)]
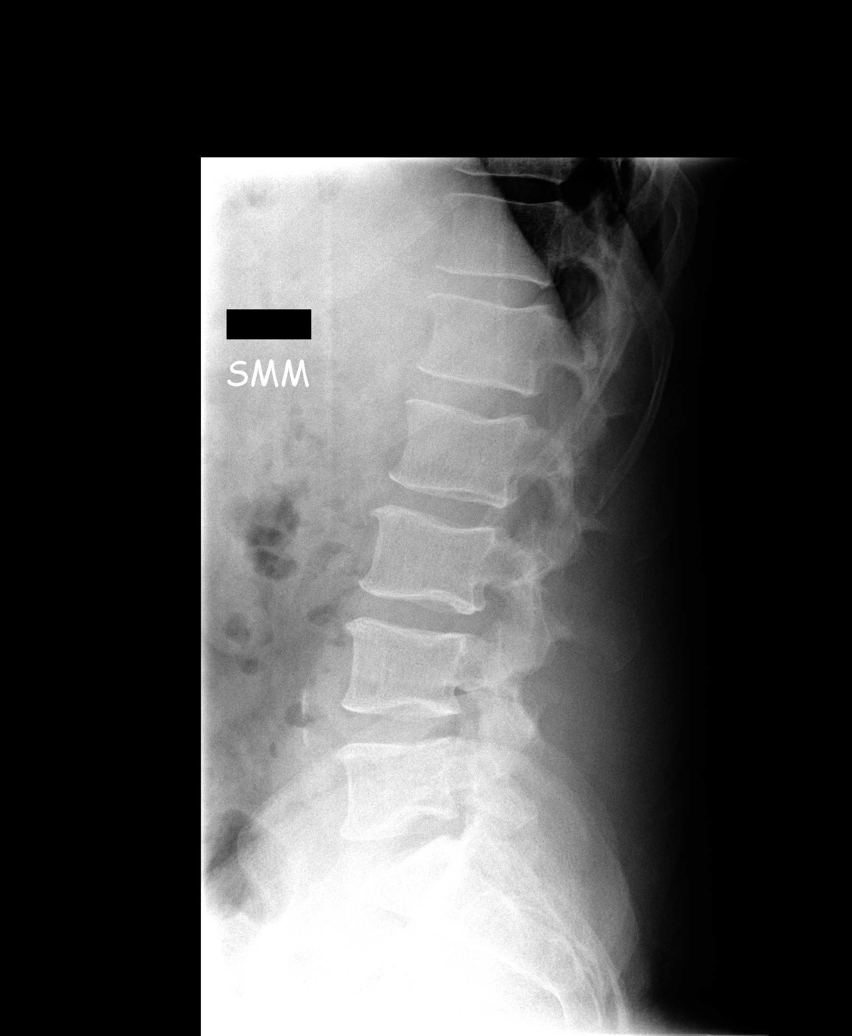

[view not recorded (5 of 5)]
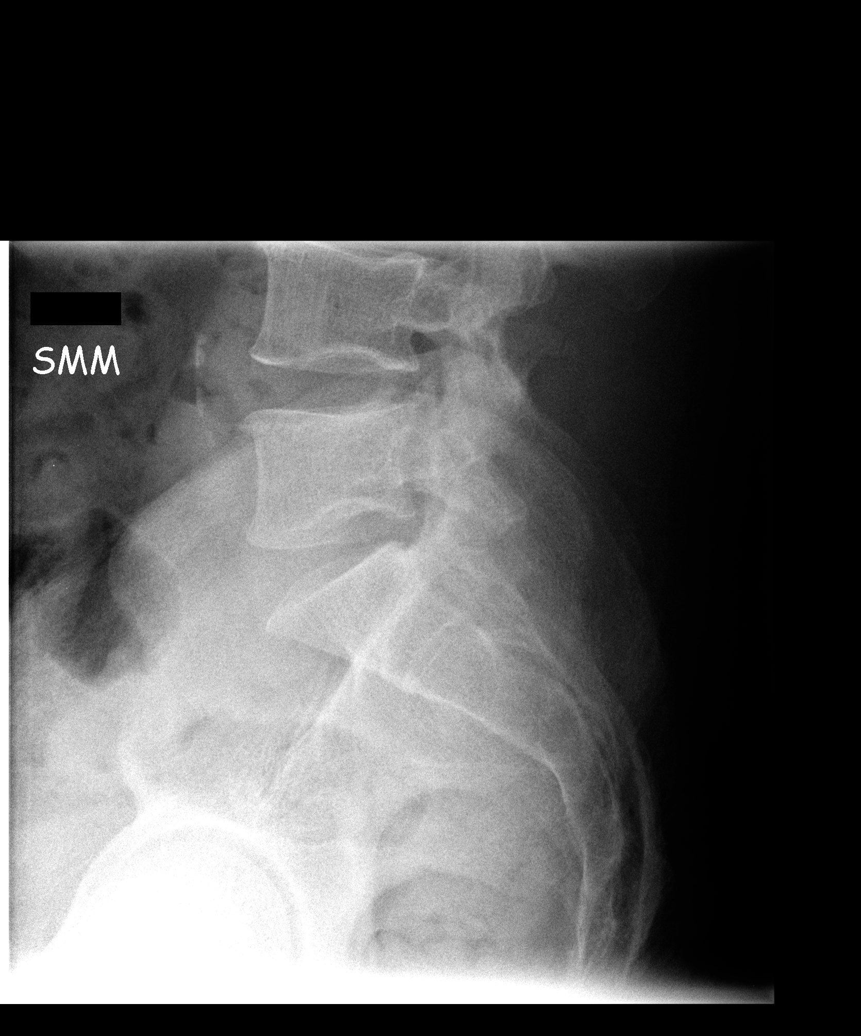

[5 of 5 positions shown; findings below may reference images not displayed]

FINDINGS: The lumbar vertebral bodies are preserved in height. The disc space
heights are well maintained. There is no spondylolisthesis. The
facet joints are intact. There is spina bifida occulta at L5 and
possibly at S1. The pedicles and transverse processes are intact.
The observed portions of the sacrum exhibit no acute abnormalities.
IMPRESSION: There is no significant disc space narrowing nor other degenerative
change of the lumbar spine. Spina bifida occulta at L5 and likely at
S1 is demonstrated.

## 2015-11-20 ENCOUNTER — Other Ambulatory Visit: Payer: Self-pay | Admitting: Family Medicine

## 2015-11-20 DIAGNOSIS — M545 Low back pain: Principal | ICD-10-CM

## 2015-11-20 DIAGNOSIS — M542 Cervicalgia: Secondary | ICD-10-CM

## 2015-11-20 DIAGNOSIS — G8929 Other chronic pain: Secondary | ICD-10-CM

## 2015-11-20 MED FILL — DULoxetine HCL 30 MG CPEP: 30 | 30 days supply | Qty: 30 | Fill #1

## 2015-11-20 MED FILL — GABAPENTIN 300 MG CAPSULE: 300 | 30 days supply | Qty: 180 | Fill #1

## 2015-11-20 NOTE — Telephone Encounter (Signed)
Tramadol ready for pick up Patient to sign controlled substance contract  

## 2015-11-20 NOTE — Telephone Encounter (Signed)
Patient is requesting prescription refill for tramadol....please follow up

## 2015-11-24 NOTE — Telephone Encounter (Signed)
LVM Rx at front office ready to be pickup 

## 2015-12-01 ENCOUNTER — Emergency Department (HOSPITAL_BASED_OUTPATIENT_CLINIC_OR_DEPARTMENT_OTHER)
Admission: EM | Admit: 2015-12-01 | Discharge: 2015-12-01 | Disposition: A | Discharge status: Home / Self Care | Payer: Self-pay | Attending: Emergency Medicine | Admitting: Emergency Medicine

## 2015-12-01 ENCOUNTER — Encounter (HOSPITAL_BASED_OUTPATIENT_CLINIC_OR_DEPARTMENT_OTHER): Payer: Self-pay

## 2015-12-01 DIAGNOSIS — M5440 Lumbago with sciatica, unspecified side: Secondary | ICD-10-CM | POA: Insufficient documentation

## 2015-12-01 DIAGNOSIS — M545 Low back pain, unspecified: Secondary | ICD-10-CM

## 2015-12-01 DIAGNOSIS — F329 Major depressive disorder, single episode, unspecified: Secondary | ICD-10-CM | POA: Insufficient documentation

## 2015-12-01 DIAGNOSIS — M5430 Sciatica, unspecified side: Secondary | ICD-10-CM

## 2015-12-01 DIAGNOSIS — F17210 Nicotine dependence, cigarettes, uncomplicated: Secondary | ICD-10-CM | POA: Insufficient documentation

## 2015-12-01 DIAGNOSIS — G8929 Other chronic pain: Secondary | ICD-10-CM | POA: Insufficient documentation

## 2015-12-01 HISTORY — DX: Dorsalgia, unspecified: M54.9

## 2015-12-01 HISTORY — DX: Other chronic pain: G89.29

## 2015-12-01 MED ORDER — HYDROCODONE-ACETAMINOPHEN 5-325 MG PO TABS: 1.0000 | ORAL_TABLET | ORAL | Status: DC | PRN

## 2015-12-01 MED ORDER — DEXAMETHASONE SODIUM PHOSPHATE 10 MG/ML IJ SOLN
10.0000 mg | Freq: Once | INTRAMUSCULAR | Status: AC
  Administered 2015-12-01: 10 mg via INTRAMUSCULAR
  Filled 2015-12-01: qty 1

## 2015-12-01 MED ORDER — PREDNISONE 10 MG (21) PO TBPK: 10.0000 mg | ORAL_TABLET | Freq: Every day | ORAL | Status: DC

## 2015-12-01 NOTE — ED Notes (Signed)
MD at bedside. 

## 2015-12-01 NOTE — Discharge Instructions (Signed)
Back Injury Prevention  Back injuries can be very painful. They can also be difficult to heal. After having one back injury, you are more likely to injure your back again. It is important to learn how to avoid injuring or re-injuring your back. The following tips can help you to prevent a back injury.  WHAT SHOULD I KNOW ABOUT PHYSICAL FITNESS?  · Exercise for 30 minutes per day on most days of the week or as told by your doctor. Make sure to:  ¨ Do aerobic exercises, such as walking, jogging, biking, or swimming.  ¨ Do exercises that increase balance and strength, such as tai chi and yoga.  ¨ Do stretching exercises. This helps with flexibility.  ¨ Try to develop strong belly (abdominal) muscles. Your belly muscles help to support your back.  · Stay at a healthy weight. This helps to decrease your risk of a back injury.  WHAT SHOULD I KNOW ABOUT MY DIET?  · Talk with your doctor about your overall diet. Take supplements and vitamins only as told by your doctor.  · Talk with your doctor about how much calcium and vitamin D you need each day. These nutrients help to prevent weakening of the bones (osteoporosis).  · Include good sources of calcium in your diet, such as:    Dairy products.    Green leafy vegetables.    Products that have had calcium added to them (fortified).  · Include good sources of vitamin D in your diet, such as:    Milk.    Foods that have had vitamin D added to them.  WHAT SHOULD I KNOW ABOUT MY POSTURE?  · Sit up straight and stand up straight. Avoid leaning forward when you sit or hunching over when you stand.  · Choose chairs that have good low-back (lumbar) support.  · If you work at a desk, sit close to it so you do not need to lean over. Keep your chin tucked in. Keep your neck drawn back. Keep your elbows bent so your arms look like the letter "L" (right angle).  · Sit high and close to the steering wheel when you drive. Add a low-back support to your car seat, if needed.  · Avoid sitting  or standing in one position for very long. Take breaks to get up, stretch, and walk around at least one time every hour. Take breaks every hour if you are driving for long periods of time.  · Sleep on your side with your knees slightly bent, or sleep on your back with a pillow under your knees. Do not lie on the front of your body to sleep.  WHAT SHOULD I KNOW ABOUT LIFTING, TWISTING, AND REACHING  Lifting and Heavy Lifting   · Avoid heavy lifting, especially lifting over and over again. If you must do heavy lifting:    Stretch before lifting.    Work slowly.    Rest between lifts.    Use a tool such as a cart or a dolly to move objects if one is available.    Make several small trips instead of carrying one heavy load.    Ask for help when you need it, especially when moving big objects.  · Follow these steps when lifting:    Stand with your feet shoulder-width apart.    Get as close to the object as you can. Do not pick up a heavy object that is far from your body.    Use handles or lifting   as close to the center of your body as possible.  Follow these steps when putting down a heavy load:  Stand with your feet shoulder-width apart.  Lower the object slowly while you tighten the muscles in your legs, belly, and butt. Keep the object as close to the center of your body as possible.  Keep your shoulders back. Keep your chin tucked in. Keep your back straight.  Bend at your knees. Squat down, but keep your heels off the floor.  Use handles or lifting straps if they are available. Twisting and Reaching  Avoid lifting heavy objects above your waist.  Do not twist at your waist while you are lifting or carrying a load. If  you need to turn, move your feet.  Do not bend over without bending at your knees.  Avoid reaching over your head, across a table, or for an object on a high surface.  WHAT ARE SOME OTHER TIPS?  Avoid wet floors and icy ground. Keep sidewalks clear of ice to prevent falls.   Do not sleep on a mattress that is too soft or too hard.   Keep items that you use often within easy reach.   Put heavier objects on shelves at waist level, and put lighter objects on lower or higher shelves.  Find ways to lower your stress, such as:  Exercise.  Massage.  Relaxation techniques.  Talk with your doctor if you feel anxious or depressed. These conditions can make back pain worse.  Wear flat heel shoes with cushioned soles.  Avoid making quick (sudden) movements.  Use both shoulder straps when carrying a backpack.  Do not use any tobacco products, including cigarettes, chewing tobacco, or electronic cigarettes. If you need help quitting, ask your doctor.   This information is not intended to replace advice given to you by your health care provider. Make sure you discuss any questions you have with your health care provider.   Document Released: 12/08/2007 Document Revised: 11/05/2014 Document Reviewed: 06/25/2014 Elsevier Interactive Patient Education [employer].

## 2015-12-01 NOTE — ED Provider Notes (Signed)
CSN: [id number]     Arrival date & time 12/01/15  [id number] History   First MD Initiated Contact with Patient 12/01/15 [phone]     Chief Complaint  Patient presents with  . Back Pain  PT IS HERE WITH CHRONIC BACK PAIN.  THE PT SAID THAT HE HAS HAD IT FOR A LONG TIME.  [facility] AND IS SUPPLIED WITH MEDS FOR HIS SX, BUT NO NARCOTICS.  PT SAID THAT HE CAN'T AFFORD THE PAIN CENTER.  THE PT SAID THAT HIS LEGS FEEL NUMB AND HE FREQUENTLY FALLS.  HE SAID THAT PAIN HAS BEEN A LITTLE WORSE THAN NORMAL.  PT DID ADMIT TO 1 EPISODE OF URINARY INCONT A FEW DAYS AGO.  NONE SINCE THEN.  PT DID HAVE AN MRI LUMBAR SPINE 2 YEARS AGO WHICH SHOWED MULTIPLE BULGING DISCS.   (Consider location/radiation/quality/duration/timing/severity/associated sxs/prior Treatment) The history is provided by the patient.    Past Medical History  Diagnosis Date  . Anxiety   . Spina bifida   . Depression   . Chronic back pain    Past Surgical History  Procedure Laterality Date  . Back surgery     Family History  Problem Relation Age of Onset  . Mental illness Mother     PANIC ATTACKS  . Heart disease Father   . Spina bifida Sister   . Cancer Paternal Grandfather    Social History  Substance Use Topics  . Smoking status: Current Every Day Smoker -- 1.00 packs/day for 30 years    Types: Cigarettes  . Smokeless tobacco: Never Used  . Alcohol Use: No    Review of Systems  Musculoskeletal: Positive for back pain.  Neurological: Positive for numbness.  All other systems reviewed and are negative.     Allergies  Review of patient's allergies indicates no known allergies.  Home Medications   Prior to Admission medications   Medication Sig Start Date End Date Taking? Authorizing Provider  DULoxetine (CYMBALTA) 30 MG capsule Take 1 capsule (30 mg total) by mouth daily. 10/07/15   Josalyn Funches, MD  gabapentin (NEURONTIN) 300 MG capsule Take 2 capsules (600 mg total) by mouth 3  (three) times daily. 10/07/15   Josalyn Funches, MD  HYDROcodone-acetaminophen (NORCO/VICODIN) 5-325 MG tablet Take 1 tablet by mouth every 4 (four) hours as needed. 12/01/15   Jacalyn LefevreJulie Jaziah Goeller, MD  hydrOXYzine (ATARAX/VISTARIL) 25 MG tablet Take 1 tablet (25 mg total) by mouth 3 (three) times daily as needed. 10/07/15   Josalyn Funches, MD  ibuprofen (ADVIL,MOTRIN) 200 MG tablet Take 800 mg by mouth every 6 (six) hours as needed for headache or moderate pain. Reported on 10/07/2015    Historical Provider, MD  predniSONE (STERAPRED UNI-PAK 21 TAB) 10 MG (21) TBPK tablet Take 1 tablet (10 mg total) by mouth daily. Take 6 tabs by mouth daily  for 2 days, then 5 tabs for 2 days, then 4 tabs for 2 days, then 3 tabs for 2 days, 2 tabs for 2 days, then 1 tab by mouth daily for 2 days 12/01/15   Jacalyn LefevreJulie Paeton Studer, MD  pregabalin (LYRICA) 50 MG capsule Take 1 capsule (50 mg total) by mouth 3 (three) times daily. For PASS 10/07/15   Dessa PhiJosalyn Funches, MD  traMADol (ULTRAM) 50 MG tablet Take 1 tablet (50 mg total) by mouth every 6 (six) hours as needed. 11/20/15   Josalyn Funches, MD  Vitamin D, Ergocalciferol, (DRISDOL) 50000 UNITS CAPS capsule Take 1 capsule (50,000 Units  total) by mouth every 7 (seven) days. 04/03/14   Doris Cheadle, MD   BP 119/86 mmHg  Pulse 67  Temp(Src) 98.3 F (36.8 C) (Oral)  Resp 18  Ht  (1.905 m)  Wt 240 lb (108.863 kg)  BMI 30.00 kg/m2  SpO2 96% Physical Exam  Constitutional: He is oriented to person, place, and time. He appears well-developed and well-nourished.  HENT:  Head: Normocephalic and atraumatic.  Right Ear: External ear normal.  Left Ear: External ear normal.  Nose: Nose normal.  Mouth/Throat: Oropharynx is clear and moist.  Eyes: Conjunctivae and EOM are normal. Pupils are equal, round, and reactive to light.  Neck: Normal range of motion. Neck supple.  Cardiovascular: Normal rate, regular rhythm, normal heart sounds and intact distal pulses.   Pulmonary/Chest: Effort  normal and breath sounds normal.  Abdominal: Soft. Bowel sounds are normal.  Musculoskeletal:       Arms: Neurological: He is alert and oriented to person, place, and time.  Skin: Skin is warm and dry.  Psychiatric: He has a normal mood and affect. His behavior is normal. Judgment and thought content normal.  Nursing note and vitals reviewed.   ED Course  Procedures (including critical care time) Labs Review Labs Reviewed - No data to display  Imaging Review No results found. I have personally reviewed and evaluated these images and lab results as part of my medical decision-making.   EKG Interpretation None      MDM  PT ABLE TO AMBULATE.  THIS PAIN APPEARS TO BE CHRONIC.  PT KNOWS TO F/U WITH [facility]. Final diagnoses:  Acute exacerbation of chronic low back pain  Sciatica associated with disorder of lumbar spine, unspecified laterality      Jacalyn Lefevre, MD 12/01/15 [phone]

## 2015-12-01 NOTE — ED Notes (Addendum)
Pt reports hx of chronic back pain.  States he had a fall "about 15 times" 3 days ago.  Reports since this time his tramadol and neurontin are not working for pain control.  Reports "leg not working".  When asked if he has voided or stooled on self, states initially no but then reports during this episode he voided on himself.  Ambulatory without difficulty. When interviewing pt for triage, patient account of incident waxing and waning.  Reports pain running down bilateral legs and he feels he is "almost paralyzed" in his left leg with chronic numbness.

## 2015-12-30 MED FILL — GABAPENTIN 300 MG CAPSULE: 300 | 30 days supply | Qty: 180 | Fill #2

## 2016-01-16 ENCOUNTER — Ambulatory Visit: Payer: Self-pay | Admitting: Family Medicine

## 2016-01-28 ENCOUNTER — Other Ambulatory Visit: Payer: Self-pay | Admitting: Family Medicine

## 2016-01-28 DIAGNOSIS — G8929 Other chronic pain: Secondary | ICD-10-CM

## 2016-01-28 DIAGNOSIS — M545 Low back pain: Principal | ICD-10-CM

## 2016-01-28 DIAGNOSIS — M542 Cervicalgia: Secondary | ICD-10-CM

## 2016-01-28 MED FILL — GABAPENTIN 300 MG CAPSULE: 300 | 30 days supply | Qty: 180 | Fill #3

## 2016-01-28 NOTE — Telephone Encounter (Signed)
Rx Request 

## 2016-01-29 ENCOUNTER — Ambulatory Visit: Payer: Self-pay | Admitting: Family Medicine

## 2016-02-14 ENCOUNTER — Other Ambulatory Visit: Payer: Self-pay | Admitting: Family Medicine

## 2016-02-14 DIAGNOSIS — M545 Low back pain: Principal | ICD-10-CM

## 2016-02-14 DIAGNOSIS — M542 Cervicalgia: Secondary | ICD-10-CM

## 2016-02-14 DIAGNOSIS — G8929 Other chronic pain: Secondary | ICD-10-CM

## 2016-02-16 NOTE — Telephone Encounter (Signed)
Pt. Called requesting a refill on the following medication:  Tramadol Gabapentin   Please f/u with pt.

## 2016-02-16 NOTE — Telephone Encounter (Signed)
Patient is overdue for f/u appt appt needed prior to med refills

## 2016-02-20 ENCOUNTER — Encounter: Payer: Self-pay | Admitting: Family Medicine

## 2016-02-20 ENCOUNTER — Ambulatory Visit: Payer: Medicaid Other | Attending: Family Medicine | Admitting: Family Medicine

## 2016-02-20 DIAGNOSIS — F418 Other specified anxiety disorders: Secondary | ICD-10-CM

## 2016-02-20 DIAGNOSIS — Z79899 Other long term (current) drug therapy: Secondary | ICD-10-CM | POA: Insufficient documentation

## 2016-02-20 DIAGNOSIS — F17210 Nicotine dependence, cigarettes, uncomplicated: Secondary | ICD-10-CM | POA: Diagnosis not present

## 2016-02-20 DIAGNOSIS — R200 Anesthesia of skin: Secondary | ICD-10-CM | POA: Diagnosis not present

## 2016-02-20 DIAGNOSIS — M545 Low back pain: Secondary | ICD-10-CM | POA: Diagnosis not present

## 2016-02-20 DIAGNOSIS — M542 Cervicalgia: Secondary | ICD-10-CM | POA: Insufficient documentation

## 2016-02-20 DIAGNOSIS — G8929 Other chronic pain: Secondary | ICD-10-CM | POA: Diagnosis not present

## 2016-02-20 DIAGNOSIS — F419 Anxiety disorder, unspecified: Secondary | ICD-10-CM

## 2016-02-20 DIAGNOSIS — F329 Major depressive disorder, single episode, unspecified: Secondary | ICD-10-CM | POA: Diagnosis present

## 2016-02-20 MED ORDER — TRAMADOL HCL 50 MG PO TABS: 50.0000 mg | ORAL_TABLET | Freq: Four times a day (QID) | ORAL | 2 refills | Status: DC | PRN

## 2016-02-20 MED ORDER — DIAZEPAM 5 MG PO TABS: 5.0000 mg | ORAL_TABLET | Freq: Two times a day (BID) | ORAL | 2 refills | Status: DC | PRN

## 2016-02-20 MED ORDER — GABAPENTIN 400 MG PO CAPS: ORAL_CAPSULE | ORAL | 5 refills | Status: AC

## 2016-02-20 NOTE — Patient Instructions (Addendum)
Paul Baldwin was seen today for follow-up.  Diagnoses and all orders for this visit:  Chronic low back pain -     diazepam (VALIUM) 5 MG tablet; Take 1 tablet (5 mg total) by mouth every 12 (twelve) hours as needed for anxiety. -     gabapentin (NEURONTIN) 400 MG capsule; take 1 capsule by mouth three times a day -     traMADol (ULTRAM) 50 MG tablet; Take 1 tablet (50 mg total) by mouth every 6 (six) hours as needed.  Cervicalgia -     diazepam (VALIUM) 5 MG tablet; Take 1 tablet (5 mg total) by mouth every 12 (twelve) hours as needed for anxiety. -     gabapentin (NEURONTIN) 400 MG capsule; take 1 capsule by mouth three times a day -     traMADol (ULTRAM) 50 MG tablet; Take 1 tablet (50 mg total) by mouth every 6 (six) hours as needed.   F/u in 4-5 weeks for check in  Dr.  Armen PickupFunches

## 2016-02-20 NOTE — Progress Notes (Addendum)
Subjective:  Patient ID: Paul Baldwin, male    DOB: 1971-02-18  Age: 45 y.o. MRN: [id number]  CC: Follow-up (neck and back pain)   HPI Paul Baldwin presents for   1. Neck pain: this is chronic. Has weakness in R hand. Uninsured.  Unemployed. Lives with 46 yo son. Compliant with all meds. Has not yet applied for patient assistance for lyrica. Also has chronic back pain. No fecal or urinary incontinence. No leg weakness. He admits to anxious and depressed mood. Current level of pain 7-8/10. Pain on a good day is 5/10. Worse days 10/10.   2. Low back pain: this is also chronic. With numbness and weakness in L leg at times. He has of lumbar back surgery. No fecal or urinary incontinence. Symptoms are overall stable.   Past Surgical History:  Procedure Laterality Date  . BACK SURGERY      Social History  Substance Use Topics  . Smoking status: Current Every Day Smoker    Packs/day: 1.00    Years: 30.00    Types: Cigarettes  . Smokeless tobacco: Never Used  . Alcohol use No    Outpatient Medications Prior to Visit  Medication Sig Dispense Refill  . gabapentin (NEURONTIN) 300 MG capsule Take 2 capsules (600 mg total) by mouth 3 (three) times daily. 180 capsule 3  . gabapentin (NEURONTIN) 400 MG capsule take 1 capsule by mouth three times a day 270 capsule 0  . traMADol (ULTRAM) 50 MG tablet Take 1 tablet (50 mg total) by mouth every 6 (six) hours as needed. 120 tablet 2  . DULoxetine (CYMBALTA) 30 MG capsule Take 1 capsule (30 mg total) by mouth daily. (Patient not taking: Reported on 02/20/2016) 30 capsule 2  . HYDROcodone-acetaminophen (NORCO/VICODIN) 5-325 MG tablet Take 1 tablet by mouth every 4 (four) hours as needed. (Patient not taking: Reported on 02/20/2016) 6 tablet 0  . hydrOXYzine (ATARAX/VISTARIL) 25 MG tablet Take 1 tablet (25 mg total) by mouth 3 (three) times daily as needed. (Patient not taking: Reported on 02/20/2016) 60 tablet 2  . ibuprofen  (ADVIL,MOTRIN) 200 MG tablet Take 800 mg by mouth every 6 (six) hours as needed for headache or moderate pain. Reported on 10/07/2015    . predniSONE (STERAPRED UNI-PAK 21 TAB) 10 MG (21) TBPK tablet Take 1 tablet (10 mg total) by mouth daily. Take 6 tabs by mouth daily  for 2 days, then 5 tabs for 2 days, then 4 tabs for 2 days, then 3 tabs for 2 days, 2 tabs for 2 days, then 1 tab by mouth daily for 2 days (Patient not taking: Reported on 02/20/2016) 42 tablet 0  . Vitamin D, Ergocalciferol, (DRISDOL) 50000 UNITS CAPS capsule Take 1 capsule (50,000 Units total) by mouth every 7 (seven) days. (Patient not taking: Reported on 02/20/2016) 12 capsule 0   No facility-administered medications prior to visit.     ROS Review of Systems  Constitutional: Negative for chills, fatigue, fever and unexpected weight change.  Eyes: Negative for visual disturbance.  Respiratory: Negative for cough and shortness of breath.   Cardiovascular: Negative for chest pain, palpitations and leg swelling.  Gastrointestinal: Negative for abdominal pain, blood in stool, constipation, diarrhea, nausea and vomiting.  Endocrine: Negative for polydipsia, polyphagia and polyuria.  Musculoskeletal: Positive for back pain and neck pain. Negative for arthralgias, gait problem and myalgias.  Skin: Negative for rash.  Allergic/Immunologic: Negative for immunocompromised state.  Neurological: Positive for numbness.  Hematological: Negative for adenopathy.  Does not bruise/bleed easily.  Psychiatric/Behavioral: Negative for dysphoric mood, sleep disturbance and suicidal ideas. The patient is not nervous/anxious.     Objective:  BP 115/77 (BP Location: Left Arm, Patient Position: Sitting, Cuff Size: Large)   Pulse 69   Temp 98.6 F (37 C) (Oral)   Ht 6\' 3"  (1.905 m)   Wt 228 lb 3.2 oz (103.5 kg)   SpO2 97%   BMI 28.52 kg/m   BP/Weight 02/20/2016 12/01/2015 10/07/2015  Systolic BP 115 117 97  Diastolic BP 77 72 67  Wt. (Lbs)  228.2 240 231  BMI 28.52 30 28.87    Physical Exam  Constitutional: He appears well-developed and well-nourished. No distress.  HENT:  Head: Normocephalic and atraumatic.  Neck: Neck supple. Muscular tenderness present. No spinous process tenderness present. Decreased range of motion present.  Cardiovascular: Normal rate, regular rhythm, normal heart sounds and intact distal pulses.   Pulmonary/Chest: Effort normal and breath sounds normal.  Musculoskeletal: He exhibits no edema.       Lumbar back: He exhibits tenderness.       Back:  Full ROM of fingers Patient able to make a fist without difficulty in both hands during test of arm strength   Back Exam: Back: Normal Curvature, no deformities or CVA tenderness  Paraspinal Tenderness: lumbar   LE Strength 5/5 on R, 4/5 on L  LE Sensation: in tact  LE Reflexes 2+ and symmetric  Straight leg raise: negative    Neurological: He is alert.  Skin: Skin is warm and dry. No rash noted. No erythema.  Psychiatric: He has a normal mood and affect.   Depression screen [facility] 2/9 02/20/2016 02/20/2016 10/07/2015  Decreased Interest 3 1 3   Down, Depressed, Hopeless 3 1 3   PHQ - 2 Score 6 2 6   Altered sleeping 3 - 3  Tired, decreased energy 3 - 3  Change in appetite 2 - 3  Feeling bad or failure about yourself  3 - 3  Trouble concentrating 2 - 2  Moving slowly or fidgety/restless 3 - 3  Suicidal thoughts 2 - 0  PHQ-9 Score 24 - 23    GAD 7 : Generalized Anxiety Score 02/20/2016 10/07/2015  Nervous, Anxious, on Edge 3 3  Control/stop worrying 3 3  Worry too much - different things 2 3  Trouble relaxing 3 3  Restless 2 2  Easily annoyed or irritable 2 3  Afraid - awful might happen 1 1  Total GAD 7 Score 16 18      Assessment & Plan:  Paul Baldwin was seen today for follow-up.  Diagnoses and all orders for this visit:  Chronic low back pain -     Discontinue: diazepam (VALIUM) 5 MG tablet; Take 1 tablet (5 mg total) by mouth every 12  (twelve) hours as needed for anxiety. -     gabapentin (NEURONTIN) 400 MG capsule; take 1 capsule by mouth three times a day -     Discontinue: traMADol (ULTRAM) 50 MG tablet; Take 1 tablet (50 mg total) by mouth every 6 (six) hours as needed. -     traMADol (ULTRAM) 50 MG tablet; Take 1 tablet (50 mg total) by mouth every 6 (six) hours as needed. -     diazepam (VALIUM) 5 MG tablet; Take 1 tablet (5 mg total) by mouth every 12 (twelve) hours as needed for anxiety.  Cervicalgia -     Discontinue: diazepam (VALIUM) 5 MG tablet; Take 1 tablet (5 mg total) by  mouth every 12 (twelve) hours as needed for anxiety. -     gabapentin (NEURONTIN) 400 MG capsule; take 1 capsule by mouth three times a day -     Discontinue: traMADol (ULTRAM) 50 MG tablet; Take 1 tablet (50 mg total) by mouth every 6 (six) hours as needed. -     traMADol (ULTRAM) 50 MG tablet; Take 1 tablet (50 mg total) by mouth every 6 (six) hours as needed. -     diazepam (VALIUM) 5 MG tablet; Take 1 tablet (5 mg total) by mouth every 12 (twelve) hours as needed for anxiety.   There are no diagnoses linked to this encounter. There are no diagnoses linked to this encounter. No orders of the defined types were placed in this encounter.   Follow-up: Return in about 4 weeks (around 03/19/2016) for chronic back and neck pain .   Dessa PhiJosalyn Antonea Gaut MD

## 2016-02-23 NOTE — Assessment & Plan Note (Signed)
A; chronic pain P: Continue current regimen Add valium

## 2016-02-23 NOTE — Assessment & Plan Note (Signed)
Persistent and severe Add valium 5 mg BID to regimen

## 2016-02-23 NOTE — Assessment & Plan Note (Signed)
A; chronic pain P: Continue regimen Add valium

## 2016-02-26 MED FILL — GABAPENTIN 400 MG CAPSULE: 400 | 30 days supply | Qty: 90 | Fill #0

## 2016-02-29 ENCOUNTER — Encounter (HOSPITAL_BASED_OUTPATIENT_CLINIC_OR_DEPARTMENT_OTHER): Payer: Self-pay | Admitting: Emergency Medicine

## 2016-02-29 ENCOUNTER — Emergency Department (HOSPITAL_BASED_OUTPATIENT_CLINIC_OR_DEPARTMENT_OTHER)
Admission: EM | Admit: 2016-02-29 | Discharge: 2016-02-29 | Disposition: A | Discharge status: Home / Self Care | Payer: Medicaid Other | Attending: Emergency Medicine | Admitting: Emergency Medicine

## 2016-02-29 DIAGNOSIS — F17210 Nicotine dependence, cigarettes, uncomplicated: Secondary | ICD-10-CM | POA: Insufficient documentation

## 2016-02-29 DIAGNOSIS — M549 Dorsalgia, unspecified: Secondary | ICD-10-CM

## 2016-02-29 DIAGNOSIS — G8929 Other chronic pain: Secondary | ICD-10-CM

## 2016-02-29 DIAGNOSIS — J40 Bronchitis, not specified as acute or chronic: Secondary | ICD-10-CM | POA: Insufficient documentation

## 2016-02-29 DIAGNOSIS — M545 Low back pain: Secondary | ICD-10-CM | POA: Insufficient documentation

## 2016-02-29 MED ORDER — HYDROMORPHONE HCL 1 MG/ML IJ SOLN
2.0000 mg | Freq: Once | INTRAMUSCULAR | Status: AC
  Administered 2016-02-29: 2 mg via INTRAMUSCULAR
  Filled 2016-02-29: qty 2

## 2016-02-29 MED ORDER — PREDNISONE 20 MG PO TABS: 40.0000 mg | ORAL_TABLET | Freq: Every day | ORAL | 0 refills | Status: DC

## 2016-02-29 MED ORDER — ALBUTEROL SULFATE HFA 108 (90 BASE) MCG/ACT IN AERS
1.0000 | INHALATION_SPRAY | RESPIRATORY_TRACT | Status: DC | PRN
  Administered 2016-02-29: 1 via RESPIRATORY_TRACT
  Filled 2016-02-29: qty 6.7

## 2016-02-29 MED ORDER — ALBUTEROL SULFATE (2.5 MG/3ML) 0.083% IN NEBU
5.0000 mg | INHALATION_SOLUTION | Freq: Once | RESPIRATORY_TRACT | Status: AC
  Administered 2016-02-29: 5 mg via RESPIRATORY_TRACT
  Filled 2016-02-29: qty 6

## 2016-02-29 MED ORDER — KETOROLAC TROMETHAMINE 60 MG/2ML IM SOLN
60.0000 mg | Freq: Once | INTRAMUSCULAR | Status: AC
  Administered 2016-02-29: 60 mg via INTRAMUSCULAR
  Filled 2016-02-29: qty 2

## 2016-02-29 MED ORDER — IPRATROPIUM BROMIDE 0.02 % IN SOLN
0.5000 mg | Freq: Once | RESPIRATORY_TRACT | Status: AC
  Administered 2016-02-29: 0.5 mg via RESPIRATORY_TRACT
  Filled 2016-02-29: qty 2.5

## 2016-02-29 NOTE — ED Triage Notes (Signed)
Patient reports that he has had chronic low back pain x years that he also has a cold.

## 2016-02-29 NOTE — ED Provider Notes (Signed)
[facility]   CSN: [id number] Arrival date & time: 02/29/16  1228     History   Chief Complaint Chief Complaint  Patient presents with  . Back Pain    HPI Paul Baldwin is a 45 y.o. male.  The history is provided by the patient.  Back Pain   This is a chronic problem. Episode onset: years but worse over the last 2-3 days. The problem occurs constantly. The problem has been gradually worsening. The pain is associated with no known injury (but has been coughing a lot lately which started before the pain). The pain is present in the lumbar spine. The quality of the pain is described as shooting and stabbing. The pain does not radiate. The pain is at a severity of 10/10. The pain is severe. The symptoms are aggravated by bending, twisting and certain positions. The pain is worse during the night. Stiffness is present in the morning. Associated symptoms include numbness, bowel incontinence, paresthesias and weakness. Pertinent negatives include no fever, no abdominal pain, no bladder incontinence, no dysuria and no leg pain. Associated symptoms comments: Patient states he has occasional intermittent bowel incontinence. He feels the urge but is unable to hold it. However this is not constant but has an more frequent since the pain in the last few days. He denies any urinary retention symptoms. For the last 2 years he has had ongoing numbness and weakness of the left lower extremity and right upper extremity due to his cervical and lumbar disc disease but feels that it's at its baseline currently. He has tried muscle relaxants (Tramadol) for the symptoms. The treatment provided no relief. Risk factors include poor posture.    Past Medical History:  Diagnosis Date  . Anxiety   . Chronic back pain   . Depression   . Spina bifida     Patient Active Problem List   Diagnosis Date Noted  . Cervicalgia 10/08/2015  . Anxiety and depression 10/07/2015  . Numbness  and tingling in hands 05/02/2015  . Insomnia 08/22/2014  . Chronic low back pain 04/02/2014  . History of back surgery 04/02/2014  . Protruded lumbar disc 04/02/2014  . 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 Frequent headaches 01/25/2013  . 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 Intracranial pressure increased 01/25/2013    Past Surgical History:  Procedure Laterality Date  . BACK SURGERY         Home Medications    Prior to Admission medications   Medication Sig Start Date End Date Taking? Authorizing Provider  diazepam (VALIUM) 5 MG tablet Take 1 tablet (5 mg total) by mouth every 12 (twelve) hours as needed for anxiety. 02/20/16   Dessa Phi, MD  gabapentin (NEURONTIN) 400 MG capsule take 1 capsule by mouth three times a day 02/20/16   Dessa Phi, MD  ibuprofen (ADVIL,MOTRIN) 200 MG tablet Take 800 mg by mouth every 6 (six) hours as needed for headache or moderate pain. Reported on 10/07/2015    Historical Provider, MD  traMADol (ULTRAM) 50 MG tablet Take 1 tablet (50 mg total) by mouth every 6 (six) hours as needed. 02/20/16   Dessa Phi, MD    Family History Family History  Problem Relation Age of Onset  . Mental illness Mother     PANIC ATTACKS  . Heart disease Father   . Spina bifida Sister   . Cancer Paternal Grandfather     Social History Social History  Substance Use Topics  .  Smoking status: Current Every Day Smoker    Packs/day: 1.00    Years: 30.00    Types: Cigarettes  . Smokeless tobacco: Never Used  . Alcohol use No     Allergies   Review of patient's allergies indicates no known allergies.   Review of Systems Review of Systems  Constitutional: Negative for fever.  HENT: Negative for congestion, mouth sores and postnasal drip.   Respiratory: Positive for cough, shortness of breath and wheezing.        Dry hacking cough for the last 5 days. Intermittent shortness of breath but not constant. Intermittent wheezing    Gastrointestinal: Positive for bowel incontinence. Negative for abdominal pain.  Genitourinary: Negative for bladder incontinence, dysuria and urgency.  Musculoskeletal: Positive for back pain.  Neurological: Positive for weakness, numbness and paresthesias.  All other systems reviewed and are negative.    Physical Exam Updated Vital Signs BP 116/75 (BP Location: Right Arm)   Pulse 96   Temp 98.3 F (36.8 C) (Oral)   Resp 18   Ht 6\' 2"  (1.88 m)   Wt 220 lb (99.8 kg)   SpO2 100%   BMI 28.25 kg/m   Physical Exam  Constitutional: He is oriented to person, place, and time. He appears well-developed and well-nourished. No distress.  HENT:  Head: Normocephalic and atraumatic.  Mouth/Throat: Oropharynx is clear and moist.  Eyes: Conjunctivae and EOM are normal. Pupils are equal, round, and reactive to light.  Neck: Normal range of motion. Neck supple.  Cardiovascular: Normal rate, regular rhythm and intact distal pulses.   No murmur heard. Pulmonary/Chest: Effort normal. No respiratory distress. He has wheezes. He has no rales.  Wheezing with cough  Abdominal: Soft. He exhibits no distension. There is no tenderness. There is no rebound and no guarding.  Genitourinary: Rectal exam shows anal tone abnormal.  Genitourinary Comments: Mild decreased tone but still able to appreciate sphincter squeeze  Musculoskeletal: He exhibits tenderness. He exhibits no edema.       Lumbar back: He exhibits decreased range of motion, tenderness, bony tenderness, pain and spasm.       Back:  Neurological: He is alert and oriented to person, place, and time. A sensory deficit is present.  Reflex Scores:      Patellar reflexes are 1+ on the right side. 3/5 strength in LLE with decreased sensation.  Toenail on the left great toe avulsed. 5/5 strength in RLE.   4/5 strength in RUE with decreased sensation.   5/5 strength in the LUE  with ambulations slightly drags the LLE  Skin: Skin is warm and  dry. No rash noted. No erythema.  Psychiatric: He has a normal mood and affect. His behavior is normal.  Nursing note and vitals reviewed.    ED Treatments / Results  Labs (all labs ordered are listed, but only abnormal results are displayed) Labs Reviewed - No data to display  EKG  EKG Interpretation None       Radiology No results found.  Procedures Procedures (including critical care time)  Medications Ordered in ED Medications  albuterol (PROVENTIL) (2.5 MG/3ML) 0.083% nebulizer solution 5 mg (not administered)  ipratropium (ATROVENT) nebulizer solution 0.5 mg (not administered)  HYDROmorphone (DILAUDID) injection 2 mg (not administered)     Initial Impression / Assessment and Plan / ED Course  I have reviewed the triage vital signs and the nursing notes.  Pertinent labs & imaging results that were available during my care of the patient were reviewed  by me and considered in my medical decision making (see chart for details).  Clinical Course   Patient is a 45 year old male with 2 issues today. Initially his cough for 5 days with intermittent wheezing and occasional shortness of breath. He denies any infectious symptoms. Patient has a wheezy cough on exam but otherwise clear breath sounds. He is a chronic smoker but denies any prior diagnosis of lung disease. Low suspicion for pneumonia at this time. No evidence of fluid overload concerning for CHF. Patient given albuterol and Atrovent.  Secondly patient is complaining of severe low back pain. He has a significant history of chronic back pain with multilevel disease who needs surgery but at this time is still waiting for medical insurance to get it. He has chronic issues with the left lower extremity with weakness and foot drop. He often drags his leg. He complains of an occasional issue with difficulty controlling his bowel but still has the urge to go. He denies any urinary retention.  On exam patient has 3 out of 5  strength in the left lower extremity with decreased sensation. He has mild decreased rectal tone but still has sphincter squeeze. Decreased sensation in the right upper extremity with 4 out of 5 hand grip and biceps strength.  Patient states that sensation and weakness in the left leg today is at its baseline. Patient given pain control. He has tramadol and Valium at home. Encouraged patient to continue following with his PCP until his Medicaid goes through and he can be seen by neurosurgery for repair.  Low suspicion for epidural abscess or infectious cause of his worsening pain. No signs of cauda equina .  Feel that his pain exacerbation may be related to all the coughing he's done in the last 4-5 days which is when the pain has gotten worse.  Final Clinical Impressions(s) / ED Diagnoses   Final diagnoses:  Chronic back pain  Bronchitis    New Prescriptions New Prescriptions   PREDNISONE (DELTASONE) 20 MG TABLET    Take 2 tablets (40 mg total) by mouth daily.     Gwyneth SproutWhitney Amahri Dengel, MD 02/29/16 1414

## 2016-03-01 ENCOUNTER — Telehealth: Payer: Self-pay | Admitting: Family Medicine

## 2016-03-01 NOTE — Telephone Encounter (Signed)
We will be able to transition back to tylenol #3 next month, but cannot at this time as patient just received 120 tramadol

## 2016-03-01 NOTE — Telephone Encounter (Signed)
Pt calling stating that current pain medication is not working for him Pt requesting to try Tylenol #3 again  Pt was advised that he will probably need an OV and states he will call back to schedule

## 2016-03-01 NOTE — Telephone Encounter (Signed)
Spoke with pt and informed him that Tylenol#3 can not be prescribed until the end of the month.

## 2016-03-01 NOTE — Telephone Encounter (Signed)
Pt was called on 8/25 left voicemail for pt to return phone call.

## 2016-03-02 ENCOUNTER — Emergency Department (HOSPITAL_BASED_OUTPATIENT_CLINIC_OR_DEPARTMENT_OTHER)
Admission: EM | Admit: 2016-03-02 | Discharge: 2016-03-02 | Disposition: A | Discharge status: Home / Self Care | Payer: Medicaid Other | Attending: Emergency Medicine | Admitting: Emergency Medicine

## 2016-03-02 ENCOUNTER — Encounter (HOSPITAL_BASED_OUTPATIENT_CLINIC_OR_DEPARTMENT_OTHER): Payer: Self-pay | Admitting: *Deleted

## 2016-03-02 DIAGNOSIS — M545 Low back pain, unspecified: Secondary | ICD-10-CM

## 2016-03-02 DIAGNOSIS — F17210 Nicotine dependence, cigarettes, uncomplicated: Secondary | ICD-10-CM | POA: Insufficient documentation

## 2016-03-02 DIAGNOSIS — G8929 Other chronic pain: Secondary | ICD-10-CM | POA: Diagnosis not present

## 2016-03-02 DIAGNOSIS — M549 Dorsalgia, unspecified: Secondary | ICD-10-CM | POA: Diagnosis present

## 2016-03-02 MED ORDER — BUPIVACAINE HCL 0.5 % IJ SOLN
20.0000 mL | Freq: Once | INTRAMUSCULAR | Status: AC
  Administered 2016-03-02: 20 mL
  Filled 2016-03-02: qty 1

## 2016-03-02 NOTE — ED Provider Notes (Signed)
[facility]   CSN: [id number] Arrival date & time: 03/02/16  1730 By signing my name below, I, Bridgette HabermannMaria Tan, attest that this documentation has been prepared under the direction and in the presence of Lyndal Pulleyaniel Zoii Florer, MD. Electronically Signed: Bridgette HabermannMaria Tan, ED Scribe. 03/02/16. 7:20 PM.  History   Chief Complaint Chief Complaint  Patient presents with  . Back Pain   HPI Comments: Paul Baldwin is a 45 y.o. male with h/o chronic back pain who presents to the Emergency Department complaining of sudden onset, constant, 10/10 lower back pain onset three years ago, worsening this past week. Pt has chronic back pain and h/o back surgery, he states he is prescribed medications from his PCP that normally helps the back pain but has not been helping this past week. Pt states pain is exacerbated with laying down and standing for long periods. Pt was seen here 2 days ago and given Prednisone with mild, temporary relief. Pt states he's seen his PCP who will not prescribe him anything stronger than Tramadol, but has told him he will get him into pain management but pt declined. Pt denies any numbness or paresthesia.  The history is provided by the patient. No language interpreter was used.    Past Medical History:  Diagnosis Date  . Anxiety   . Chronic back pain   . Depression   . Spina bifida     Patient Active Problem List   Diagnosis Date Noted  . Cervicalgia 10/08/2015  . Anxiety and depression 10/07/2015  . Numbness and tingling in hands 05/02/2015  . Insomnia 08/22/2014  . Chronic low back pain 04/02/2014  . History of back surgery 04/02/2014  . Protruded lumbar disc 04/02/2014  . 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 Frequent headaches 01/25/2013  . 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 Intracranial pressure increased 01/25/2013    Past Surgical History:  Procedure Laterality Date  . BACK SURGERY         Home Medications    Prior to  Admission medications   Medication Sig Start Date End Date Taking? Authorizing Provider  diazepam (VALIUM) 5 MG tablet Take 1 tablet (5 mg total) by mouth every 12 (twelve) hours as needed for anxiety. 02/20/16   Dessa PhiJosalyn Funches, MD  gabapentin (NEURONTIN) 400 MG capsule take 1 capsule by mouth three times a day 02/20/16   Dessa PhiJosalyn Funches, MD  ibuprofen (ADVIL,MOTRIN) 200 MG tablet Take 800 mg by mouth every 6 (six) hours as needed for headache or moderate pain. Reported on 10/07/2015    Historical Provider, MD  predniSONE (DELTASONE) 20 MG tablet Take 2 tablets (40 mg total) by mouth daily. 02/29/16   Gwyneth SproutWhitney Plunkett, MD  traMADol (ULTRAM) 50 MG tablet Take 1 tablet (50 mg total) by mouth every 6 (six) hours as needed. 02/20/16   Dessa PhiJosalyn Funches, MD    Family History Family History  Problem Relation Age of Onset  . Mental illness Mother     PANIC ATTACKS  . Heart disease Father   . Spina bifida Sister   . Cancer Paternal Grandfather     Social History Social History  Substance Use Topics  . Smoking status: Current Every Day Smoker    Packs/day: 1.00    Years: 30.00    Types: Cigarettes  . Smokeless tobacco: Never Used  . Alcohol use No     Allergies   Review of patient's allergies indicates no known allergies.   Review of Systems Review of  Systems  Constitutional: Negative for fever.  Musculoskeletal: Positive for back pain.  All other systems reviewed and are negative.    Physical Exam Updated Vital Signs BP 102/62   Pulse 98   Temp 98.3 F (36.8 C) (Oral)   Resp 20   Ht 6\' 2"  (1.88 m)   Wt 220 lb (99.8 kg)   SpO2 97%   BMI 28.25 kg/m   Physical Exam  Constitutional: He appears well-developed and well-nourished.  HENT:  Head: Normocephalic.  Eyes: Conjunctivae are normal.  Cardiovascular: Normal rate.   Pulmonary/Chest: Effort normal. No respiratory distress.  Abdominal: He exhibits no distension.  Musculoskeletal: Normal range of motion. He exhibits  tenderness.  Bilateral lumbar paraspinal tenderness. Full strength of bilateral lower extremities.  Neurological: He is alert.  Skin: Skin is warm and dry.  Psychiatric: He has a normal mood and affect. His behavior is normal.  Nursing note and vitals reviewed.    ED Treatments / Results  DIAGNOSTIC STUDIES: Oxygen Saturation is 97% on RA, adequate by my interpretation.    COORDINATION OF CARE: 7:20 PM Discussed treatment plan with pt at bedside which includes anti-inflammatory medications and pt agreed to plan.  Labs (all labs ordered are listed, but only abnormal results are displayed) Labs Reviewed - No data to display  EKG  EKG Interpretation None       Radiology No results found.  Procedures Procedures (including critical care time)  Procedure Note: Trigger Point Injection for Myofascial pain  Performed by Dr. Clydene Pugh Indication: muscle/myofascial pain Muscle body and tendon sheath of the bilateral lumbar paraspinal muscle(s) were injected with 0.5% bupivacaine under sterile technique for release of muscle spasm/pain. Patient tolerated well with immediate improvement of symptoms and no immediate complications following procedure.  CPT Code:   1 or 2 muscle bodies: 20552  Medications Ordered in ED Medications - No data to display   Initial Impression / Assessment and Plan / ED Course  I have reviewed the triage vital signs and the nursing notes.  Pertinent labs & imaging results that were available during my care of the patient were reviewed by me and considered in my medical decision making (see chart for details).  Clinical Course    45 y.o. male presents with back pain in lumbar area chronically without signs of radicular pain. Likely from prolonged stasis in setting of chronic pain. No acute traumatic onset. No red flag symptoms of fever, weight loss, saddle anesthesia, weakness, fecal/urinary incontinence or urinary retention. Offered local trigger point  injection for symptomatic treatment. Suspect MSK etiology. No indication for imaging emergently. Patient was recommended to take short course of scheduled NSAIDs and engage in early mobility as definitive treatment. Needs to establish pain management and PCP f/u as previously recommended which he has not done. Return precautions discussed for worsening or new concerning symptoms.    Final Clinical Impressions(s) / ED Diagnoses   Final diagnoses:  Bilateral low back pain without sciatica  Chronic back pain    New Prescriptions New Prescriptions   No medications on file  I personally performed the services described in this documentation, which was scribed in my presence. The recorded information has been reviewed and is accurate.      Lyndal Pulley, MD 03/02/16 [phone]

## 2016-03-02 NOTE — ED Triage Notes (Addendum)
Back pain. States he has back problems. He was seen here 2 days ago but pain is no better. States his MD won't give him anything stronger than Tramadol and has told him he will get him into pain management but he does not want to go.

## 2016-03-22 ENCOUNTER — Telehealth: Payer: Self-pay | Admitting: Family Medicine

## 2016-03-22 NOTE — Telephone Encounter (Signed)
Patient called the office to speak with nurse regarding a letter that he needs PCP to write. Patient stated that this letter will be presented to a judge for child support. Pt needs PCP to explain that he can't work due to his condition (dislocated disks) as well as write all the medications that he is currently taking. Please follow up.   Thank you

## 2016-03-26 MED FILL — GABAPENTIN 400 MG CAPSULE: 400 | 30 days supply | Qty: 90 | Fill #1

## 2016-04-06 NOTE — Telephone Encounter (Signed)
Letter written and ready for pick up.

## 2016-04-07 NOTE — Telephone Encounter (Signed)
Pt was called on 10/4 and informed of letter being ready for pick up.

## 2016-04-12 ENCOUNTER — Ambulatory Visit: Payer: Medicaid Other | Attending: Family Medicine | Admitting: Family Medicine

## 2016-04-12 ENCOUNTER — Encounter (INDEPENDENT_AMBULATORY_CARE_PROVIDER_SITE_OTHER): Payer: Self-pay

## 2016-04-12 ENCOUNTER — Encounter: Payer: Self-pay | Admitting: Family Medicine

## 2016-04-12 ENCOUNTER — Other Ambulatory Visit: Payer: Self-pay

## 2016-04-12 VITALS — BP 113/74 | HR 98 | Temp 98.5°F | Ht 75.0 in | Wt 198.6 lb

## 2016-04-12 DIAGNOSIS — F17210 Nicotine dependence, cigarettes, uncomplicated: Secondary | ICD-10-CM | POA: Diagnosis not present

## 2016-04-12 DIAGNOSIS — M545 Low back pain: Secondary | ICD-10-CM | POA: Diagnosis not present

## 2016-04-12 DIAGNOSIS — Z79899 Other long term (current) drug therapy: Secondary | ICD-10-CM | POA: Diagnosis not present

## 2016-04-12 DIAGNOSIS — G8929 Other chronic pain: Secondary | ICD-10-CM | POA: Diagnosis not present

## 2016-04-12 DIAGNOSIS — R079 Chest pain, unspecified: Secondary | ICD-10-CM

## 2016-04-12 DIAGNOSIS — Z23 Encounter for immunization: Secondary | ICD-10-CM

## 2016-04-12 DIAGNOSIS — J209 Acute bronchitis, unspecified: Secondary | ICD-10-CM | POA: Diagnosis not present

## 2016-04-12 DIAGNOSIS — M542 Cervicalgia: Secondary | ICD-10-CM | POA: Insufficient documentation

## 2016-04-12 DIAGNOSIS — R05 Cough: Secondary | ICD-10-CM | POA: Diagnosis present

## 2016-04-12 MED ORDER — AZITHROMYCIN 250 MG PO TABS: ORAL_TABLET | ORAL | 0 refills | Status: DC

## 2016-04-12 MED ORDER — ACETAMINOPHEN-CODEINE #3 300-30 MG PO TABS
1.0000 | ORAL_TABLET | Freq: Three times a day (TID) | ORAL | 0 refills | Status: DC | PRN
Start: 2016-04-12 — End: 2016-05-07

## 2016-04-12 NOTE — Patient Instructions (Addendum)
Paul Baldwin was seen today for cough.  Diagnoses and all orders for this visit:  Acute bronchitis, unspecified organism -     acetaminophen-codeine (TYLENOL #3) 300-30 MG tablet; Take 1-2 tablets by mouth every 8 (eight) hours as needed. -     DG Chest 2 View; Future -     azithromycin (ZITHROMAX) 250 MG tablet; 500 mg first day, then 250 mg daily for 4 days  Chest pain, unspecified type -     EKG 12-Lead  Needs flu shot   Please complete ordered chest x-ray   F/u in 2 weeks for cough   Dr. Armen PickupFunches

## 2016-04-12 NOTE — Progress Notes (Signed)
Subjective:  Patient ID: Paul Baldwin, male    DOB: 1970/08/22  Age: 45 y.o. MRN: [id number]  CC: Cough (pt having chest pains due to coughing)   HPI Gelene MinkFrederick A Baldwin has chronic neck and low back pain, he  is a chronic heavy smoker he presents for   1. Cough: x 2 weeks. With R sided chest pains. HA and congestion. He went to outside urgent care and was treated with a few shots, he does not recall what medications, 5 day course of prednisone 20 mg BID and an albuterol inhaler. He felt better for a few days but now feels worse. He is having trouble sleeping, smoking makes his cough worse. He has R sided chest pain with cough. No sick contacts. No fever or chills.   Social History  Substance Use Topics  . Smoking status: Current Every Day Smoker    Packs/day: 1.00    Years: 30.00    Types: Cigarettes  . Smokeless tobacco: Never Used  . Alcohol use No    Outpatient Medications Prior to Visit  Medication Sig Dispense Refill  . diazepam (VALIUM) 5 MG tablet Take 1 tablet (5 mg total) by mouth every 12 (twelve) hours as needed for anxiety. 60 tablet 2  . gabapentin (NEURONTIN) 400 MG capsule take 1 capsule by mouth three times a day 90 capsule 5  . ibuprofen (ADVIL,MOTRIN) 200 MG tablet Take 800 mg by mouth every 6 (six) hours as needed for headache or moderate pain. Reported on 10/07/2015    . predniSONE (DELTASONE) 20 MG tablet Take 2 tablets (40 mg total) by mouth daily. 10 tablet 0  . traMADol (ULTRAM) 50 MG tablet Take 1 tablet (50 mg total) by mouth every 6 (six) hours as needed. 120 tablet 2   No facility-administered medications prior to visit.     ROS Review of Systems  Constitutional: Negative for chills, fatigue, fever and unexpected weight change.  Eyes: Negative for visual disturbance.  Respiratory: Positive for cough. Negative for shortness of breath.   Cardiovascular: Positive for chest pain. Negative for palpitations and leg swelling.  Gastrointestinal:  Negative for abdominal pain, blood in stool, constipation, diarrhea, nausea and vomiting.  Endocrine: Negative for polydipsia, polyphagia and polyuria.  Musculoskeletal: Positive for back pain and neck pain. Negative for arthralgias, gait problem and myalgias.  Skin: Negative for rash.  Allergic/Immunologic: Negative for immunocompromised state.  Neurological: Positive for numbness and headaches.  Hematological: Negative for adenopathy. Does not bruise/bleed easily.  Psychiatric/Behavioral: Negative for dysphoric mood, sleep disturbance and suicidal ideas. The patient is not nervous/anxious.     Objective:  BP 113/74 (BP Location: Left Arm, Patient Position: Sitting, Cuff Size: Small)   Pulse 98   Temp 98.5 F (36.9 C) (Oral)   Ht 6\' 3"  (1.905 m)   Wt 198 lb 9.6 oz (90.1 kg)   SpO2 97%   BMI 24.82 kg/m   BP/Weight 04/12/2016 03/02/2016 02/29/2016  Systolic BP 113 102 106  Diastolic BP 74 62 65  Wt. (Lbs) 198.6 220 220  BMI 24.82 28.25 28.25    Physical Exam  Constitutional: He appears well-developed and well-nourished. No distress.  HENT:  Head: Normocephalic and atraumatic.  Right Ear: External ear normal.  Left Ear: External ear normal.  Nose: Nose normal.  Mouth/Throat: No oropharyngeal exudate.  Cerumen impacting both ears   Neck: Normal range of motion. Neck supple.  Cardiovascular: Normal rate, regular rhythm, normal heart sounds and intact distal pulses.   Pulmonary/Chest:  Effort normal and breath sounds normal. Right breast exhibits no skin change. Left breast exhibits skin change. Breasts are symmetrical.  Musculoskeletal: He exhibits no edema.  Neurological: He is alert.  Skin: Skin is warm and dry. No rash noted. No erythema.  Psychiatric: He has a normal mood and affect.   EKG: normal EKG, normal sinus rhythm, unchanged from previous tracings.  Assessment & Plan:  Sohrab was seen today for cough.  Diagnoses and all orders for this visit:  Acute  bronchitis, unspecified organism -     acetaminophen-codeine (TYLENOL #3) 300-30 MG tablet; Take 1-2 tablets by mouth every 8 (eight) hours as needed. -     DG Chest 2 View; Future -     azithromycin (ZITHROMAX) 250 MG tablet; 500 mg first day, then 250 mg daily for 4 days  Chest pain, unspecified type -     EKG 12-Lead  Needs flu shot  Encounter for immunization -     Flu Vaccine QUAD 36+ mos IM   There are no diagnoses linked to this encounter.  No orders of the defined types were placed in this encounter.   Follow-up: Return in about 2 weeks (around 04/26/2016) for cough .   Dessa Phi MD

## 2016-04-12 NOTE — Progress Notes (Signed)
Chest pains are due to coughing. Pt has had cough x 2 weeks. Pt is getting flu shot today.

## 2016-04-12 NOTE — Assessment & Plan Note (Signed)
Cough x 3 weeks with R sided chest pain, smoker, non focal lung exam. Suspect bronchitiis Plan: Continue albuterol prn Recent course of prednisone, no hypoxemia CXR 5 days course of azithromycin

## 2016-04-19 MED FILL — GABAPENTIN 400 MG CAPSULE: 400 | 30 days supply | Qty: 90 | Fill #2

## 2016-05-07 ENCOUNTER — Encounter: Payer: Self-pay | Admitting: Licensed Clinical Social Worker

## 2016-05-07 ENCOUNTER — Encounter: Payer: Self-pay | Admitting: Family Medicine

## 2016-05-07 ENCOUNTER — Ambulatory Visit: Payer: Medicaid Other | Attending: Family Medicine | Admitting: Family Medicine

## 2016-05-07 VITALS — BP 124/76 | HR 85 | Temp 98.7°F | Ht 75.0 in | Wt 221.8 lb

## 2016-05-07 DIAGNOSIS — F17210 Nicotine dependence, cigarettes, uncomplicated: Secondary | ICD-10-CM | POA: Insufficient documentation

## 2016-05-07 DIAGNOSIS — Z23 Encounter for immunization: Secondary | ICD-10-CM

## 2016-05-07 DIAGNOSIS — R05 Cough: Secondary | ICD-10-CM | POA: Diagnosis not present

## 2016-05-07 DIAGNOSIS — F329 Major depressive disorder, single episode, unspecified: Secondary | ICD-10-CM | POA: Insufficient documentation

## 2016-05-07 DIAGNOSIS — F419 Anxiety disorder, unspecified: Secondary | ICD-10-CM | POA: Insufficient documentation

## 2016-05-07 DIAGNOSIS — M545 Low back pain: Secondary | ICD-10-CM | POA: Insufficient documentation

## 2016-05-07 DIAGNOSIS — G8929 Other chronic pain: Secondary | ICD-10-CM

## 2016-05-07 DIAGNOSIS — M542 Cervicalgia: Secondary | ICD-10-CM | POA: Diagnosis not present

## 2016-05-07 DIAGNOSIS — F418 Other specified anxiety disorders: Secondary | ICD-10-CM

## 2016-05-07 MED ORDER — DIAZEPAM 10 MG PO TABS: 10.0000 mg | ORAL_TABLET | Freq: Two times a day (BID) | ORAL | 2 refills | Status: DC | PRN

## 2016-05-07 MED ORDER — VENLAFAXINE HCL ER 75 MG PO CP24: 75.0000 mg | ORAL_CAPSULE | Freq: Every day | ORAL | 2 refills | Status: AC

## 2016-05-07 MED ORDER — ACETAMINOPHEN-CODEINE #3 300-30 MG PO TABS: 1.0000 | ORAL_TABLET | Freq: Three times a day (TID) | ORAL | 0 refills | Status: DC | PRN

## 2016-05-07 MED ORDER — VENLAFAXINE HCL ER 75 MG PO CP24: 75.0000 mg | ORAL_CAPSULE | Freq: Every day | ORAL | 2 refills | Status: DC

## 2016-05-07 MED ORDER — DIAZEPAM 10 MG PO TABS: 10.0000 mg | ORAL_TABLET | Freq: Two times a day (BID) | ORAL | 2 refills | Status: AC | PRN

## 2016-05-07 NOTE — Assessment & Plan Note (Signed)
Resolved With treatment

## 2016-05-07 NOTE — Assessment & Plan Note (Signed)
A; chronic back pain with radicular symptoms. Patient uninsured. Attempts to obtain MRI and refer to neurosurgery made in recent past P: Increase valium to 10 mg BID Transition from tramadol to tylenol #3 Add Effexor for chronic pain with depressed mood

## 2016-05-07 NOTE — Assessment & Plan Note (Signed)
A; chronic neck pain with radicular symptoms. Patient uninsured. Attempts to obtain MRI and refer to neurosurgery made in recent past P: Increase valium to 10 mg BID Transition from tramadol to tylenol #3 Add Effexor for chronic pain with depressed mood

## 2016-05-07 NOTE — Progress Notes (Signed)
Pt is here today to follow up on cough, and medication refills.  Pt pain level is 10 for back, legs, and hands.

## 2016-05-07 NOTE — Assessment & Plan Note (Addendum)
A: persistent P: Valium increased to 10 mg BID Add effexor Internal referral to CSW

## 2016-05-07 NOTE — Progress Notes (Addendum)
Subjective:  Patient ID: Paul Baldwin, male    DOB: 12-16-1970  Age: 45 y.o. MRN: [id number]  CC: Cough   HPI Paul Baldwin has chronic neck and low back pain, he  is a chronic heavy smoker he presents for   1. Cough: resolved. No CP or SOB. He completed course of prednisone and azithromycin. He did not complete ordered chest x-ray.   2. Chronic pain: 10/10 pain in hands, back and legs. Taking tramadol, valium, had tylenol #3 last month. Admits to depression since he cannot do the things he enjoys (fishing, ball games, cleaning up). He reports that the tylenol #3 controlled his pain better.   Social History  Substance Use Topics  . Smoking status: Current Every Day Smoker    Packs/day: 1.00    Years: 30.00    Types: Cigarettes  . Smokeless tobacco: Never Used  . Alcohol use No    Outpatient Medications Prior to Visit  Medication Sig Dispense Refill  . acetaminophen-codeine (TYLENOL #3) 300-30 MG tablet Take 1-2 tablets by mouth every 8 (eight) hours as needed. 30 tablet 0  . azithromycin (ZITHROMAX) 250 MG tablet 500 mg first day, then 250 mg daily for 4 days 6 tablet 0  . diazepam (VALIUM) 5 MG tablet Take 1 tablet (5 mg total) by mouth every 12 (twelve) hours as needed for anxiety. 60 tablet 2  . gabapentin (NEURONTIN) 400 MG capsule take 1 capsule by mouth three times a day 90 capsule 5  . ibuprofen (ADVIL,MOTRIN) 200 MG tablet Take 800 mg by mouth every 6 (six) hours as needed for headache or moderate pain. Reported on 10/07/2015    . traMADol (ULTRAM) 50 MG tablet Take 1 tablet (50 mg total) by mouth every 6 (six) hours as needed. 120 tablet 2   No facility-administered medications prior to visit.     ROS Review of Systems  Constitutional: Negative for chills, fatigue, fever and unexpected weight change.  Eyes: Negative for visual disturbance.  Respiratory: Negative for cough and shortness of breath.   Cardiovascular: Negative for chest pain, palpitations  and leg swelling.  Gastrointestinal: Negative for abdominal pain, blood in stool, constipation, diarrhea, nausea and vomiting.  Endocrine: Negative for polydipsia, polyphagia and polyuria.  Musculoskeletal: Positive for back pain and neck pain. Negative for arthralgias, gait problem and myalgias.  Skin: Negative for rash.  Allergic/Immunologic: Negative for immunocompromised state.  Neurological: Positive for numbness. Negative for headaches.  Hematological: Negative for adenopathy. Does not bruise/bleed easily.  Psychiatric/Behavioral: Positive for dysphoric mood and suicidal ideas. Negative for sleep disturbance. The patient is not nervous/anxious.     Objective:  BP 124/76 (BP Location: Left Arm, Patient Position: Sitting, Cuff Size: Small)   Pulse 85   Temp 98.7 F (37.1 C) (Oral)   Ht 6\' 3"  (1.905 m)   Wt 221 lb 12.8 oz (100.6 kg)   SpO2 99%   BMI 27.72 kg/m  Temp Readings from Last 3 Encounters:  05/07/16 98.7 F (37.1 C) (Oral)  04/12/16 98.5 F (36.9 C) (Oral)  03/02/16 98.3 F (36.8 C) (Oral)    BP/Weight 05/07/2016 04/12/2016 03/02/2016  Systolic BP 124 113 102  Diastolic BP 76 74 62  Wt. (Lbs) 221.8 198.6 220  BMI 27.72 24.82 28.25    Physical Exam  Constitutional: He appears well-developed and well-nourished. No distress.  HENT:  Head: Normocephalic and atraumatic.  Right Ear: External ear normal.  Left Ear: External ear normal.  Nose: Nose normal.  Mouth/Throat:  No oropharyngeal exudate.  Neck: Normal range of motion. Neck supple.  Cardiovascular: Normal rate, regular rhythm, normal heart sounds and intact distal pulses.   Pulmonary/Chest: Effort normal and breath sounds normal.  Musculoskeletal: He exhibits no edema.  Neurological: He is alert.  Skin: Skin is warm and dry. No rash noted. No erythema.  Psychiatric: He has a normal mood and affect.   [facility] screen [facility] 2[facility]9 05/07/2016 04/12/2016 02/20/2016 02/20/2016 10/07/2015  Decreased Interest 3 3 3 1 3     Down, Depressed, Hopeless 3 3 3 1 3   PHQ - 2 Score 6 6 6 2 6   Altered sleeping 3 3 3  - 3  Tired, decreased energy 3 3 3  - 3  Change in appetite 3 3 2  - 3  Feeling bad or failure about yourself  3 3 3  - 3  Trouble concentrating 3 3 2  - 2  Moving slowly or fidgety/restless 2 3 3  - 3  Suicidal thoughts 1 1 2  - 0  PHQ-9 Score 24 25 24  - 23   GAD 7 : Generalized Anxiety Score 05/07/2016 04/12/2016 02/20/2016 10/07/2015  Nervous, Anxious, on Edge 3 3 3 3   Control/stop worrying 3 3 3 3   Worry too much - different things 3 3 2 3   Trouble relaxing 3 3 3 3   Restless 2 3 2 2   Easily annoyed or irritable 2 3 2 3   Afraid - awful might happen 1 1 1 1   Total GAD 7 Score 17 19 16 18      Assessment & Plan:  Gelene MinkFrederick was seen today for cough.  Diagnoses and all orders for this visit:  Need for Tdap vaccination -     Tdap vaccine greater than or equal to 7yo IM  Chronic bilateral low back pain, with sciatica presence unspecified -     Discontinue: acetaminophen-codeine (TYLENOL #3) 300-30 MG tablet; Take 1-2 tablets by mouth every 8 (eight) hours as needed. -     Discontinue: diazepam (VALIUM) 10 MG tablet; Take 1 tablet (10 mg total) by mouth every 12 (twelve) hours as needed for anxiety. -     Discontinue: venlafaxine XR (EFFEXOR XR) 75 MG 24 hr capsule; Take 1 capsule (75 mg total) by mouth daily with breakfast. -     diazepam (VALIUM) 10 MG tablet; Take 1 tablet (10 mg total) by mouth every 12 (twelve) hours as needed for anxiety. -     acetaminophen-codeine (TYLENOL #3) 300-30 MG tablet; Take 1-2 tablets by mouth every 8 (eight) hours as needed. -     venlafaxine XR (EFFEXOR XR) 75 MG 24 hr capsule; Take 1 capsule (75 mg total) by mouth daily with breakfast.  Cervicalgia -     Discontinue: acetaminophen-codeine (TYLENOL #3) 300-30 MG tablet; Take 1-2 tablets by mouth every 8 (eight) hours as needed. -     Discontinue: diazepam (VALIUM) 10 MG tablet; Take 1 tablet (10 mg total) by mouth every  12 (twelve) hours as needed for anxiety. -     Discontinue: venlafaxine XR (EFFEXOR XR) 75 MG 24 hr capsule; Take 1 capsule (75 mg total) by mouth daily with breakfast. -     diazepam (VALIUM) 10 MG tablet; Take 1 tablet (10 mg total) by mouth every 12 (twelve) hours as needed for anxiety. -     acetaminophen-codeine (TYLENOL #3) 300-30 MG tablet; Take 1-2 tablets by mouth every 8 (eight) hours as needed. -     venlafaxine XR (EFFEXOR XR) 75 MG 24 hr capsule; Take  1 capsule (75 mg total) by mouth daily with breakfast.  Anxiety and depression -     venlafaxine XR (EFFEXOR XR) 75 MG 24 hr capsule; Take 1 capsule (75 mg total) by mouth daily with breakfast.   There are no diagnoses linked to this encounter.  No orders of the defined types were placed in this encounter.   Follow-up: Return in about 4 weeks (around 06/04/2016) for chronic pain, anxious and depressed mood .   Dessa PhiJosalyn Emmanuella Mirante MD

## 2016-05-07 NOTE — BH Specialist Note (Signed)
Session Start time: 11:20 am   End Time: 11:35 pm Total Time:  15 minutes  Type of Service: Behavioral Health - Individual/Family Interpreter: No.   Interpreter Name & Language: N/A # [facility]   SUBJECTIVE: Paul Baldwin is a 45 y.o. male  Pt. was referred by Dr. Armen PickupFunches for:  anxiety, depression and chronic pain. Pt. reports the following symptoms/concerns: depressed mood and difficulty sleeping Duration of problem:  "multiple years" Severity: severe Previous treatment: Pt used to receive medication management for Depression "years ago". Pt currently taking prescribed medication. Not receiving therapy.   OBJECTIVE: Mood: Depressed & Affect: Depressed Risk of harm to self or others: Pt denied SI/HI Assessments administered: PHQ-9; GAD-7  LIFE CONTEXT:  Family & Social: Pt receives support from mother and adult children. He speaks to children a couple of times during the week School/ Work: Pt is unemployed. He has obtained a lawyer to assist with disability application Self-Care: Pt has difficulty sleeping due to chronic pain. He eats once a day at dinner. Denies substance use Life changes: Pt is unable to work or participate in activities that he enjoys due to chronic pain.  What is important to pt/family (values): Family   GOALS ADDRESSED:  Decrease symptoms of depression Decrease symptoms of anxiety  INTERVENTIONS: Motivational Interviewing, Strength-based and Supportive   ASSESSMENT:  Pt currently experiencing depression and anxiety triggered by inability to work or participate in activities (i.e. Fishing, hunting, races) due to chronic pain. Pt reports depressed mood and difficulty sleeping. Pt may benefit from psychotherapy and medication management. LCSWA discussed benefits of initiating behavioral health services to educate pt on effective skills and strategies to address chronic pain. LCSWA provided community resources on crisis  intervention, psychotherapy, and medication management.      PLAN: 1. F/U with behavioral health clinician: Pt was encouraged to contact LCSWA or crisis resources if symptoms worsen or fail to improve. [facility] 2. Behavioral Health meds: Effexor 3. Behavioral recommendations: Pt is encouraged to schedule follow up appointment with LCSWA 4. Referral: [facility] Counseling[facility]Psychotherapy, [facility], Psychoeducation and Supportive Counseling 5. From scale of 1-10, how likely are you to follow plan: 6/10   Bridgett LarssonJasmine D Lewis, MSW, LCSWA  Clinical Social Worker 05/07/16 12:44 pm  Warmhandoff:   Warm Hand Off Completed.

## 2016-05-07 NOTE — Patient Instructions (Addendum)
Paul Baldwin seen today for cough.  Diagnoses and all orders for this visit:  Chronic bilateral low back pain, with sciatica presence unspecified -     acetaminophen-codeine (TYLENOL #3) 300-30 MG tablet; Take 1-2 tablets by mouth every 8 (eight) hours as needed. -     diazepam (VALIUM) 10 MG tablet; Take 1 tablet (10 mg total) by mouth every 12 (twelve) hours as needed for anxiety. -     venlafaxine XR (EFFEXOR XR) 75 MG 24 hr capsule; Take 1 capsule (75 mg total) by mouth daily with breakfast.  Cervicalgia -     acetaminophen-codeine (TYLENOL #3) 300-30 MG tablet; Take 1-2 tablets by mouth every 8 (eight) hours as needed. -     diazepam (VALIUM) 10 MG tablet; Take 1 tablet (10 mg total) by mouth every 12 (twelve) hours as needed for anxiety. -     venlafaxine XR (EFFEXOR XR) 75 MG 24 hr capsule; Take 1 capsule (75 mg total) by mouth daily with breakfast.   I am happy that you are feeling better. Change from tramadol to tylenol #3, increased valium, add effexor for chronic pain  Be sure to balance meds with regular sleep, healthy diet, try to get outside at much as possible   F/u in 3-4 weeks for chronic pain, f/u effexor and change to tylenol #3  Dr. Armen PickupFunches

## 2016-05-11 ENCOUNTER — Telehealth: Payer: Self-pay | Admitting: Family Medicine

## 2016-05-11 NOTE — Telephone Encounter (Signed)
Pt. Called requesting a letter form his PCP stating his medical condition and he needs a lis of all his medications. Pt. Needs the letter for disability. Please f/u

## 2016-05-11 NOTE — Telephone Encounter (Signed)
Will route to PCP 

## 2016-05-11 NOTE — Telephone Encounter (Signed)
Please inform patient Letter ready for pick up

## 2016-05-12 NOTE — Telephone Encounter (Signed)
Pt was called and a VM was left informing pt of letter being ready for pick up.

## 2016-06-03 ENCOUNTER — Telehealth: Payer: Self-pay | Admitting: Family Medicine

## 2016-06-03 DIAGNOSIS — M545 Low back pain: Principal | ICD-10-CM

## 2016-06-03 DIAGNOSIS — M542 Cervicalgia: Secondary | ICD-10-CM

## 2016-06-03 DIAGNOSIS — G8929 Other chronic pain: Secondary | ICD-10-CM

## 2016-06-03 MED ORDER — TRAMADOL HCL 50 MG PO TABS: 50.0000 mg | ORAL_TABLET | Freq: Three times a day (TID) | ORAL | 0 refills | Status: DC | PRN

## 2016-06-03 NOTE — Telephone Encounter (Signed)
Pt was called and informed of script being ready for pick up.

## 2016-06-03 NOTE — Telephone Encounter (Signed)
Pt calling requesting to switch from Tylenol #3 back to Tramadol  Pt states Tylenol #3 is more expensive and does not work any better

## 2016-06-03 NOTE — Telephone Encounter (Signed)
Please inform patient Tramadol refilled and ready for pick up

## 2016-06-08 ENCOUNTER — Ambulatory Visit: Payer: Self-pay | Admitting: Family Medicine

## 2016-06-08 MED FILL — GABAPENTIN 400 MG CAPSULE: 400 | 30 days supply | Qty: 90 | Fill #3

## 2016-06-08 MED FILL — VENLAFAXINE HCL ER 75 MG CA: 75 | 30 days supply | Qty: 30 | Fill #0

## 2016-07-01 ENCOUNTER — Telehealth: Payer: Self-pay | Admitting: Family Medicine

## 2016-07-01 DIAGNOSIS — G8929 Other chronic pain: Secondary | ICD-10-CM

## 2016-07-01 DIAGNOSIS — M545 Low back pain: Principal | ICD-10-CM

## 2016-07-01 DIAGNOSIS — M542 Cervicalgia: Secondary | ICD-10-CM

## 2016-07-01 MED ORDER — TRAMADOL HCL 50 MG PO TABS: 50.0000 mg | ORAL_TABLET | Freq: Three times a day (TID) | ORAL | 0 refills | Status: AC | PRN

## 2016-07-01 NOTE — Telephone Encounter (Signed)
Tramadol ready for pick up Please inform patient  

## 2016-07-01 NOTE — Telephone Encounter (Signed)
Patient needs a refill for Tramadol. °Please follow up ° °

## 2016-07-01 NOTE — Telephone Encounter (Signed)
Pt was called and informed of script being ready for pick up.

## 2016-11-17 ENCOUNTER — Encounter: Payer: Self-pay | Admitting: Family Medicine
